# Patient Record
Sex: Female | Born: 1955 | Race: White | Hispanic: No | Marital: Married | Smoking: Never smoker
Health system: Southern US, Community
[De-identification: ages and names within clinical notes are randomized; demographics above are authoritative.]

## PROBLEM LIST (undated history)

## (undated) DIAGNOSIS — M549 Dorsalgia, unspecified: Secondary | ICD-10-CM

## (undated) DIAGNOSIS — Z85528 Personal history of other malignant neoplasm of kidney: Secondary | ICD-10-CM

## (undated) DIAGNOSIS — G4730 Sleep apnea, unspecified: Secondary | ICD-10-CM

## (undated) DIAGNOSIS — E7800 Pure hypercholesterolemia, unspecified: Secondary | ICD-10-CM

## (undated) DIAGNOSIS — M810 Age-related osteoporosis without current pathological fracture: Secondary | ICD-10-CM

## (undated) DIAGNOSIS — C801 Malignant (primary) neoplasm, unspecified: Secondary | ICD-10-CM

## (undated) DIAGNOSIS — R002 Palpitations: Secondary | ICD-10-CM

## (undated) DIAGNOSIS — M2550 Pain in unspecified joint: Secondary | ICD-10-CM

## (undated) DIAGNOSIS — K219 Gastro-esophageal reflux disease without esophagitis: Secondary | ICD-10-CM

## (undated) DIAGNOSIS — D649 Anemia, unspecified: Secondary | ICD-10-CM

## (undated) DIAGNOSIS — I10 Essential (primary) hypertension: Secondary | ICD-10-CM

## (undated) DIAGNOSIS — F419 Anxiety disorder, unspecified: Secondary | ICD-10-CM

## (undated) DIAGNOSIS — F32A Depression, unspecified: Secondary | ICD-10-CM

## (undated) DIAGNOSIS — M0520 Rheumatoid vasculitis with rheumatoid arthritis of unspecified site: Secondary | ICD-10-CM

## (undated) DIAGNOSIS — R0602 Shortness of breath: Secondary | ICD-10-CM

## (undated) HISTORY — DX: Sleep apnea, unspecified: G47.30

## (undated) HISTORY — PX: OTHER SURGICAL HISTORY: SHX169

## (undated) HISTORY — DX: Depression, unspecified: F32.A

## (undated) HISTORY — PX: HERNIA REPAIR: SHX51

## (undated) HISTORY — DX: Palpitations: R00.2

## (undated) HISTORY — DX: Anemia, unspecified: D64.9

## (undated) HISTORY — DX: Rheumatoid vasculitis with rheumatoid arthritis of unspecified site: M05.20

## (undated) HISTORY — PX: REPLACEMENT TOTAL KNEE: SUR1224

## (undated) HISTORY — DX: Pain in unspecified joint: M25.50

## (undated) HISTORY — DX: Age-related osteoporosis without current pathological fracture: M81.0

## (undated) HISTORY — DX: Shortness of breath: R06.02

## (undated) HISTORY — DX: Dorsalgia, unspecified: M54.9

---

## 2013-03-18 DIAGNOSIS — M1710 Unilateral primary osteoarthritis, unspecified knee: Secondary | ICD-10-CM | POA: Insufficient documentation

## 2013-12-07 ENCOUNTER — Emergency Department (HOSPITAL_BASED_OUTPATIENT_CLINIC_OR_DEPARTMENT_OTHER)
Admission: EM | Admit: 2013-12-07 | Discharge: 2013-12-07 | Disposition: A | Discharge status: Home / Self Care | Payer: Worker's Compensation | Attending: Emergency Medicine | Admitting: Emergency Medicine

## 2013-12-07 ENCOUNTER — Emergency Department (HOSPITAL_BASED_OUTPATIENT_CLINIC_OR_DEPARTMENT_OTHER): Payer: Self-pay | Attending: Emergency Medicine

## 2013-12-07 ENCOUNTER — Encounter (HOSPITAL_BASED_OUTPATIENT_CLINIC_OR_DEPARTMENT_OTHER): Payer: Self-pay | Admitting: Emergency Medicine

## 2013-12-07 DIAGNOSIS — R296 Repeated falls: Secondary | ICD-10-CM | POA: Insufficient documentation

## 2013-12-07 DIAGNOSIS — S8000XA Contusion of unspecified knee, initial encounter: Secondary | ICD-10-CM | POA: Diagnosis not present

## 2013-12-07 DIAGNOSIS — Y939 Activity, unspecified: Secondary | ICD-10-CM | POA: Insufficient documentation

## 2013-12-07 DIAGNOSIS — Z85528 Personal history of other malignant neoplasm of kidney: Secondary | ICD-10-CM | POA: Diagnosis not present

## 2013-12-07 DIAGNOSIS — I10 Essential (primary) hypertension: Secondary | ICD-10-CM | POA: Diagnosis not present

## 2013-12-07 DIAGNOSIS — S99919A Unspecified injury of unspecified ankle, initial encounter: Secondary | ICD-10-CM

## 2013-12-07 DIAGNOSIS — S8990XA Unspecified injury of unspecified lower leg, initial encounter: Secondary | ICD-10-CM | POA: Insufficient documentation

## 2013-12-07 DIAGNOSIS — F411 Generalized anxiety disorder: Secondary | ICD-10-CM | POA: Insufficient documentation

## 2013-12-07 DIAGNOSIS — K219 Gastro-esophageal reflux disease without esophagitis: Secondary | ICD-10-CM | POA: Insufficient documentation

## 2013-12-07 DIAGNOSIS — S99929A Unspecified injury of unspecified foot, initial encounter: Secondary | ICD-10-CM

## 2013-12-07 DIAGNOSIS — E7800 Pure hypercholesterolemia, unspecified: Secondary | ICD-10-CM | POA: Diagnosis not present

## 2013-12-07 DIAGNOSIS — S8002XA Contusion of left knee, initial encounter: Secondary | ICD-10-CM

## 2013-12-07 DIAGNOSIS — Y929 Unspecified place or not applicable: Secondary | ICD-10-CM | POA: Insufficient documentation

## 2013-12-07 HISTORY — DX: Personal history of other malignant neoplasm of kidney: Z85.528

## 2013-12-07 HISTORY — DX: Essential (primary) hypertension: I10

## 2013-12-07 HISTORY — DX: Anxiety disorder, unspecified: F41.9

## 2013-12-07 HISTORY — DX: Gastro-esophageal reflux disease without esophagitis: K21.9

## 2013-12-07 HISTORY — DX: Pure hypercholesterolemia, unspecified: E78.00

## 2013-12-07 HISTORY — DX: Malignant (primary) neoplasm, unspecified: C80.1

## 2013-12-07 NOTE — ED Notes (Signed)
Fell last week. Injury to her left knee. Pain, swelling and bruising noted. Hx of knee replacement 2 years ago.

## 2013-12-07 NOTE — ED Provider Notes (Signed)
Medical screening examination/treatment/procedure(s) were performed by non-physician practitioner and as supervising physician I was immediately available for consultation/collaboration.    Dot Lanes, MD 12/07/13 [phone]

## 2013-12-07 NOTE — ED Provider Notes (Signed)
CSN: [id number]     Arrival date & time 12/07/13  1607 History   First MD Initiated Contact with Patient 12/07/13 1741     Chief Complaint  Patient presents with  . Knee Injury     (Consider location/radiation/quality/duration/timing/severity/associated sxs/prior Treatment) Patient is a 58 y.o. female presenting with knee pain. The history is provided by the patient. No language interpreter was used.  Knee Pain Location:  Knee Injury: yes   Knee location:  L knee Dislocation: no   Foreign body present:  No foreign bodies Associated symptoms: no fever   Associated symptoms comment:  She presents with complaint of left knee pain and swelling since mechanical fall 5 days ago. She has been ambulating since the fall but has continued pain and swelling of the knee. She has a history of arthroplasty and was concerned about bony injury.   Past Medical History  Diagnosis Date  . GERD (gastroesophageal reflux disease)   . Hypertension   . Anxiety   . High cholesterol   . Cancer   . History of kidney cancer    Past Surgical History  Procedure Laterality Date  . Replacement total knee    . Hernia repair    . Kidney cancer surgery     No family history on file. History  Substance Use Topics  . Smoking status: Never Smoker   . Smokeless tobacco: Not on file  . Alcohol Use: No   OB History   Grav Para Term Preterm Abortions TAB SAB Ect Mult Living                 Review of Systems  Constitutional: Negative for fever and chills.  Musculoskeletal:       See HPI.  Skin: Positive for color change.  Neurological: Negative.       Allergies  Simvastatin  Home Medications   Prior to Admission medications   Medication Sig Start Date End Date Taking? Authorizing Provider  Escitalopram Oxalate (LEXAPRO PO) Take by mouth.   Yes Historical Provider, MD  OMEPRAZOLE PO Take by mouth.   Yes Historical Provider, MD  Pravastatin Sodium (PRAVACHOL PO) Take by mouth.   Yes Historical  Provider, MD  VALSARTAN PO Take by mouth.   Yes Historical Provider, MD   BP 129/75  Pulse 72  Temp(Src) 97.8 F (36.6 C) (Oral)  Resp 18  SpO2 100% Physical Exam  Constitutional: She is oriented to person, place, and time. She appears well-developed and well-nourished.  Neck: Normal range of motion.  Pulmonary/Chest: Effort normal.  Musculoskeletal:  Left knee moderately swollen anteriorly with bruising. Well healed surgical incision. Joint stable. No bony deformity.  Neurological: She is alert and oriented to person, place, and time.  Skin: Skin is warm and dry.  Psychiatric: She has a normal mood and affect.    ED Course  Procedures (including critical care time) Labs Review Labs Reviewed - No data to display  Imaging Review Dg Knee Complete 4 Views Left  12/07/2013   CLINICAL DATA:  Fall 5 days ago on the left knee.  EXAM: LEFT KNEE - COMPLETE 4+ VIEW  COMPARISON:  None.  FINDINGS: Previous left knee arthroplasty. No evidence for periprosthetic fracture or dislocation. No joint effusion identified.  IMPRESSION: 1. No acute findings. 2. Previous left knee arthroplasty.   Electronically Signed   By: Kerby Moors M.D.   On: 12/07/2013 16:47     EKG Interpretation None      MDM   Final  diagnoses:  Contusion, knee, left, initial encounter    Contusion injury 58 days old without acute fracture. Discussed supportive care. Continue ibuprofen, ice elevation.     Dewaine Oats, PA-C 12/07/13 1820

## 2013-12-07 NOTE — Discharge Instructions (Signed)
Cryotherapy °Cryotherapy means treatment with cold. Ice or gel packs can be used to reduce both pain and swelling. Ice is the most helpful within the first 24 to 48 hours after an injury or flare-up from overusing a muscle or joint. Sprains, strains, spasms, burning pain, shooting pain, and aches can all be eased with ice. Ice can also be used when recovering from surgery. Ice is effective, has very few side effects, and is safe for most people to use. °PRECAUTIONS  °Ice is not a safe treatment option for people with: °· Raynaud phenomenon. This is a condition affecting small blood vessels in the extremities. Exposure to cold may cause your problems to return. °· Cold hypersensitivity. There are many forms of cold hypersensitivity, including: °¨ Cold urticaria. Red, itchy hives appear on the skin when the tissues begin to warm after being iced. °¨ Cold erythema. This is a red, itchy rash caused by exposure to cold. °¨ Cold hemoglobinuria. Red blood cells break down when the tissues begin to warm after being iced. The hemoglobin that carry oxygen are passed into the urine because they cannot combine with blood proteins fast enough. °· Numbness or altered sensitivity in the area being iced. °If you have any of the following conditions, do not use ice until you have discussed cryotherapy with your caregiver: °· Heart conditions, such as arrhythmia, angina, or chronic heart disease. °· High blood pressure. °· Healing wounds or open skin in the area being iced. °· Current infections. °· Rheumatoid arthritis. °· Poor circulation. °· Diabetes. °Ice slows the blood flow in the region it is applied. This is beneficial when trying to stop inflamed tissues from spreading irritating chemicals to surrounding tissues. However, if you expose your skin to cold temperatures for too long or without the proper protection, you can damage your skin or nerves. Watch for signs of skin damage due to cold. °HOME CARE INSTRUCTIONS °Follow  these tips to use ice and cold packs safely. °· Place a dry or damp towel between the ice and skin. A damp towel will cool the skin more quickly, so you may need to shorten the time that the ice is used. °· For a more rapid response, add gentle compression to the ice. °· Ice for no more than 10 to 20 minutes at a time. The bonier the area you are icing, the less time it will take to get the benefits of ice. °· Check your skin after 5 minutes to make sure there are no signs of a poor response to cold or skin damage. °· Rest 20 minutes or more between uses. °· Once your skin is numb, you can end your treatment. You can test numbness by very lightly touching your skin. The touch should be so light that you do not see the skin dimple from the pressure of your fingertip. When using ice, most people will feel these normal sensations in this order: cold, burning, aching, and numbness. °· Do not use ice on someone who cannot communicate their responses to pain, such as small children or people with dementia. °HOW TO MAKE AN ICE PACK °Ice packs are the most common way to use ice therapy. Other methods include ice massage, ice baths, and cryosprays. Muscle creams that cause a cold, tingly feeling do not offer the same benefits that ice offers and should not be used as a substitute unless recommended by your caregiver. °To make an ice pack, do one of the following: °· Place crushed ice or a   bag of frozen vegetables in a sealable plastic bag. Squeeze out the excess air. Place this bag inside another plastic bag. Slide the bag into a pillowcase or place a damp towel between your skin and the bag. °· Mix 3 parts water with 1 part rubbing alcohol. Freeze the mixture in a sealable plastic bag. When you remove the mixture from the freezer, it will be slushy. Squeeze out the excess air. Place this bag inside another plastic bag. Slide the bag into a pillowcase or place a damp towel between your skin and the bag. °SEEK MEDICAL CARE  IF: °· You develop white spots on your skin. This may give the skin a blotchy (mottled) appearance. °· Your skin turns blue or pale. °· Your skin becomes waxy or hard. °· Your swelling gets worse. °MAKE SURE YOU:  °· Understand these instructions. °· Will watch your condition. °· Will get help right away if you are not doing well or get worse. °Document Released: 11/20/2010 Document Revised: 08/10/2013 Document Reviewed: 11/20/2010 °ExitCare® Patient Information ©2015 [employer]. This information is not intended to replace advice given to you by your health care provider. Make sure you discuss any questions you have with your health care provider. ° °Contusion °A contusion is a deep bruise. Contusions are the result of an injury that caused bleeding under the skin. The contusion may turn blue, purple, or yellow. Minor injuries will give you a painless contusion, but more severe contusions may stay painful and swollen for a few weeks.  °CAUSES  °A contusion is usually caused by a blow, trauma, or direct force to an area of the body. °SYMPTOMS  °· Swelling and redness of the injured area. °· Bruising of the injured area. °· Tenderness and soreness of the injured area. °· Pain. °DIAGNOSIS  °The diagnosis can be made by taking a history and physical exam. An X-ray, CT scan, or MRI may be needed to determine if there were any associated injuries, such as fractures. °TREATMENT  °Specific treatment will depend on what area of the body was injured. In general, the best treatment for a contusion is resting, icing, elevating, and applying cold compresses to the injured area. Over-the-counter medicines may also be recommended for pain control. Ask your caregiver what the best treatment is for your contusion. °HOME CARE INSTRUCTIONS  °· Put ice on the injured area. °¨ Put ice in a plastic bag. °¨ Place a towel between your skin and the bag. °¨ Leave the ice on for 15-20 minutes, 3-4 times a day, or as directed by your health  care provider. °· Only take over-the-counter or prescription medicines for pain, discomfort, or fever as directed by your caregiver. Your caregiver may recommend avoiding anti-inflammatory medicines (aspirin, ibuprofen, and naproxen) for 48 hours because these medicines may increase bruising. °· Rest the injured area. °· If possible, elevate the injured area to reduce swelling. °SEEK IMMEDIATE MEDICAL CARE IF:  °· You have increased bruising or swelling. °· You have pain that is getting worse. °· Your swelling or pain is not relieved with medicines. °MAKE SURE YOU:  °· Understand these instructions. °· Will watch your condition. °· Will get help right away if you are not doing well or get worse. °Document Released: 01/03/2005 Document Revised: 03/31/2013 Document Reviewed: 01/29/2011 °ExitCare® Patient Information ©2015 [employer]. This information is not intended to replace advice given to you by your health care provider. Make sure you discuss any questions you have with your health care provider. ° °

## 2013-12-08 DIAGNOSIS — M546 Pain in thoracic spine: Secondary | ICD-10-CM | POA: Insufficient documentation

## 2013-12-08 DIAGNOSIS — Z8719 Personal history of other diseases of the digestive system: Secondary | ICD-10-CM | POA: Insufficient documentation

## 2013-12-08 DIAGNOSIS — H6120 Impacted cerumen, unspecified ear: Secondary | ICD-10-CM | POA: Insufficient documentation

## 2013-12-08 DIAGNOSIS — M461 Sacroiliitis, not elsewhere classified: Secondary | ICD-10-CM | POA: Insufficient documentation

## 2013-12-08 DIAGNOSIS — R51 Headache: Secondary | ICD-10-CM

## 2013-12-08 DIAGNOSIS — I10 Essential (primary) hypertension: Secondary | ICD-10-CM | POA: Insufficient documentation

## 2013-12-08 DIAGNOSIS — M159 Polyosteoarthritis, unspecified: Secondary | ICD-10-CM | POA: Insufficient documentation

## 2013-12-08 DIAGNOSIS — M47816 Spondylosis without myelopathy or radiculopathy, lumbar region: Secondary | ICD-10-CM | POA: Insufficient documentation

## 2013-12-08 DIAGNOSIS — M5417 Radiculopathy, lumbosacral region: Secondary | ICD-10-CM

## 2013-12-08 DIAGNOSIS — M8580 Other specified disorders of bone density and structure, unspecified site: Secondary | ICD-10-CM | POA: Insufficient documentation

## 2013-12-08 DIAGNOSIS — G4731 Primary central sleep apnea: Secondary | ICD-10-CM | POA: Insufficient documentation

## 2013-12-08 DIAGNOSIS — G4733 Obstructive sleep apnea (adult) (pediatric): Secondary | ICD-10-CM | POA: Insufficient documentation

## 2013-12-08 DIAGNOSIS — K439 Ventral hernia without obstruction or gangrene: Secondary | ICD-10-CM | POA: Insufficient documentation

## 2013-12-08 DIAGNOSIS — M79673 Pain in unspecified foot: Secondary | ICD-10-CM | POA: Insufficient documentation

## 2013-12-08 DIAGNOSIS — M5136 Other intervertebral disc degeneration, lumbar region: Secondary | ICD-10-CM | POA: Insufficient documentation

## 2013-12-08 DIAGNOSIS — E559 Vitamin D deficiency, unspecified: Secondary | ICD-10-CM | POA: Insufficient documentation

## 2013-12-08 DIAGNOSIS — R5383 Other fatigue: Secondary | ICD-10-CM | POA: Insufficient documentation

## 2013-12-08 DIAGNOSIS — K219 Gastro-esophageal reflux disease without esophagitis: Secondary | ICD-10-CM | POA: Insufficient documentation

## 2013-12-08 DIAGNOSIS — N950 Postmenopausal bleeding: Secondary | ICD-10-CM | POA: Insufficient documentation

## 2013-12-08 DIAGNOSIS — R519 Headache, unspecified: Secondary | ICD-10-CM | POA: Insufficient documentation

## 2013-12-08 DIAGNOSIS — D509 Iron deficiency anemia, unspecified: Secondary | ICD-10-CM | POA: Insufficient documentation

## 2013-12-08 DIAGNOSIS — R3916 Straining to void: Secondary | ICD-10-CM | POA: Insufficient documentation

## 2013-12-08 DIAGNOSIS — M25559 Pain in unspecified hip: Secondary | ICD-10-CM | POA: Insufficient documentation

## 2013-12-08 DIAGNOSIS — G4485 Primary stabbing headache: Secondary | ICD-10-CM | POA: Insufficient documentation

## 2013-12-08 DIAGNOSIS — M545 Low back pain, unspecified: Secondary | ICD-10-CM | POA: Insufficient documentation

## 2013-12-08 DIAGNOSIS — L209 Atopic dermatitis, unspecified: Secondary | ICD-10-CM | POA: Insufficient documentation

## 2013-12-08 DIAGNOSIS — G4700 Insomnia, unspecified: Secondary | ICD-10-CM | POA: Insufficient documentation

## 2013-12-08 DIAGNOSIS — R42 Dizziness and giddiness: Secondary | ICD-10-CM | POA: Insufficient documentation

## 2013-12-08 DIAGNOSIS — M2240 Chondromalacia patellae, unspecified knee: Secondary | ICD-10-CM | POA: Insufficient documentation

## 2013-12-08 DIAGNOSIS — F419 Anxiety disorder, unspecified: Secondary | ICD-10-CM | POA: Insufficient documentation

## 2013-12-08 DIAGNOSIS — F329 Major depressive disorder, single episode, unspecified: Secondary | ICD-10-CM | POA: Insufficient documentation

## 2013-12-08 DIAGNOSIS — R350 Frequency of micturition: Secondary | ICD-10-CM | POA: Insufficient documentation

## 2013-12-08 DIAGNOSIS — F32A Depression, unspecified: Secondary | ICD-10-CM | POA: Insufficient documentation

## 2013-12-08 DIAGNOSIS — G253 Myoclonus: Secondary | ICD-10-CM | POA: Insufficient documentation

## 2013-12-08 DIAGNOSIS — M5414 Radiculopathy, thoracic region: Secondary | ICD-10-CM | POA: Insufficient documentation

## 2014-04-14 ENCOUNTER — Encounter: Payer: Self-pay | Admitting: Neurology

## 2014-04-14 ENCOUNTER — Ambulatory Visit (INDEPENDENT_AMBULATORY_CARE_PROVIDER_SITE_OTHER): Payer: BLUE CROSS/BLUE SHIELD | Admitting: Neurology

## 2014-04-14 VITALS — BP 130/78 | HR 64 | Resp 16 | Ht 63.75 in | Wt 194.2 lb

## 2014-04-14 DIAGNOSIS — G4730 Sleep apnea, unspecified: Secondary | ICD-10-CM

## 2014-04-14 MED ORDER — ZOLPIDEM TARTRATE 5 MG PO TABS: 5.0000 mg | ORAL_TABLET | Freq: Every day | ORAL | Status: DC

## 2014-04-14 NOTE — Progress Notes (Signed)
[facility]  PATIENT: Karen Cannon DOB: 05-09-1955  REFERRING CLINICIAN: Dr. Jefm Petty HISTORY FROM: Patient REASON FOR VISIT: Obstructive sleep apnea   HISTORICAL  CHIEF COMPLAINT:  Obstructive sleep apnea  HISTORY OF PRESENT ILLNESS:  She is a 59 year old woman who was diagnosed with severe obstructive sleep apnea with some central sleep apneas. She has been on BiPAP therapy and tolerates it well. I interrogated her BiPAP machine. It shows that she has excellent compliance with 30/30 days of use for more than 4 hours. Her average night was about 9 hours. The AHI was 8.1. She had severe OSA before being placed on BiPAP. She denies any excessive daytime sleepiness. She never really had an issue with this prior to BiPAP therapy. However, she does feel better when she uses it for the entire night. She occasionally wakes up with some leakage or some difficulty breathing out. However she feels that she tolerates it well. She takes the BiPAP with her wherever she travels.  She also reports insomnia. This has been greatly helped with zolpidem. She takes 5 mg 5 nights a week (not taking a weekends). She tolerates it well. She has not had any unusual reaction to the zolpidem.  REVIEW OF SYSTEMS: Full 14 system review of systems performed and notable only for insomnia. She denied headaches or respiratory difficulties.  ALLERGIES: Allergies  Allergen Reactions  . Simvastatin     cramps    HOME MEDICATIONS: Outpatient Prescriptions Prior to Visit  Medication Sig Dispense Refill  . Escitalopram Oxalate (LEXAPRO PO) Take by mouth.    . OMEPRAZOLE PO Take by mouth.    . Pravastatin Sodium (PRAVACHOL PO) Take by mouth.    . VALSARTAN PO Take by mouth.     No facility-administered medications prior to visit.    PAST MEDICAL HISTORY: Past Medical History  Diagnosis Date  . GERD (gastroesophageal reflux disease)   . Hypertension   . Anxiety   . High  cholesterol   . Cancer   . History of kidney cancer   . Sleep apnea     PAST SURGICAL HISTORY: Past Surgical History  Procedure Laterality Date  . Replacement total knee    . Hernia repair    . Kidney cancer surgery      FAMILY HISTORY: She denies a history of OSA in family members.  SOCIAL HISTORY:  History   Social History  . Marital Status: Married    Spouse Name: N/A    Number of Children: 3  . Years of Education: Masters   Occupational History  . Not on file.   Social History Main Topics  . Smoking status: Never Smoker   . Smokeless tobacco: Never Used  . Alcohol Use: No  . Drug Use: No  . Sexual Activity: Not on file   Other Topics Concern  . Not on file   Social History Narrative   Patient is married with one child.   Patient is left handed.   Patient has her Master's degree.   Patient drinks 2 cups daily.        PHYSICAL EXAM  Filed Vitals:   04/14/14 1537  BP: 130/78  Pulse: 64  Resp: 16  Height: 5' 3.75" (1.619 m)  Weight: 194 lb 3.2 oz (88.089 kg)    Body mass index is 33.61 kg/(m^2).  No exam data present  No flowsheet data found.  GENERAL EXAM: Patient is in no distress; well developed, nourished and groomed; neck is supple.  Pharynx is non-erythematous.  NEUROLOGIC: MENTAL STATUS: awake, alert, oriented to person, place and time, recent and remote memory intact, normal attention and concentration, language fluent, comprehension intact, naming intact, fund of knowledge appropriate CRANIAL NERVE: extraocular muscles intact, no nystagmus, facial sensation and strength symmetric, hearing intact, palate elevates symmetrically, uvula midline, shoulder shrug symmetric, tongue midline. MOTOR: normal bulk and tone, full strength in the BUE, BLE SENSORY: normal and symmetric to light touch COORDINATION: finger-nose-finger, fine finger movements, heel-shin normal REFLEXES: deep tendon reflexes present and symmetric GAIT/STATION: narrow  based gait; able to walk on toes, heels and tandem    DIAGNOSTIC DATA (LABS, IMAGING, TESTING) - I reviewed patient records, labs, notes, testing and imaging myself where available. Specifically, her BiPAP machine was interrogated and she was found to have excellent compliance at 100% and an AHI equals 8.1.   ASSESSMENT AND PLAN  59 y.o. year old female here with obstructive sleep apnea that is reasonably well controlled on oral BiPAP with a pressure range of 5-20. Compliance is excellent at 100%. Her insomnia is well controlled with zolpidem. She will continue on BiPAP current settings. She will return in one year or call sooner if any problems.   Meds ordered this encounter  Medications  . iron polysaccharides (NIFEREX) 150 MG capsule    Sig: Take by mouth.  . valsartan-hydrochlorothiazide (DIOVAN-HCT) 160-12.5 MG per tablet    Sig:   . zolpidem (AMBIEN) 5 MG tablet    Sig: 5 mg.  . escitalopram (LEXAPRO) 20 MG tablet    Sig: Take 20 mg by mouth.  Marland Kitchen omeprazole (PRILOSEC) 20 MG capsule    Sig: Take 20 mg by mouth.  . DISCONTD: zolpidem (AMBIEN) 5 MG tablet    Sig: Take 5 mg by mouth.  . B Complex Vitamins (VITAMIN-B COMPLEX) TABS    Sig: Take as directed.  Marland Kitchen RIBOFLAVIN PO    Sig: Take as directed.  . zolpidem (AMBIEN) 5 MG tablet    Sig: Take 1 tablet (5 mg total) by mouth at bedtime.    Dispense:  30 tablet    Refill:  5    Follow up in 12 months.   Call sooner if problems.    Britt Bottom M.D. PhD 0/0/3704, 8:88 PM Certified in Neurology, Sleep Medicine , Neurophysiology and Neuroimaging  [facility] [address] [place], [place] [address] [phone]

## 2014-08-26 ENCOUNTER — Telehealth: Payer: Self-pay | Admitting: Neurology

## 2014-08-26 NOTE — Telephone Encounter (Signed)
Ins has been contacted and provided with clinical info.  Request is currently under review Ref Key: [id number].  I called the patient back to advise, got no answer.  Left message.

## 2014-08-26 NOTE — Telephone Encounter (Signed)
Patient called stating that she needs a prior authorization for zolpidem (AMBIEN) 5 MG tablet. Patient advises that her [place] needs to be contacted in order for her to receive the 30 days supply. Patient can be reached @ [phone]

## 2014-08-30 ENCOUNTER — Telehealth: Payer: Self-pay

## 2014-08-30 NOTE — Telephone Encounter (Signed)
[place] has approved the request for coverage on Zolpidem effective until 08/26/2015 Ref # [id number].  They have notified patient to advise of this decision.

## 2014-11-16 ENCOUNTER — Telehealth: Payer: Self-pay | Admitting: Neurology

## 2014-11-16 MED ORDER — ZOLPIDEM TARTRATE 5 MG PO TABS: 5.0000 mg | ORAL_TABLET | Freq: Every day | ORAL | Status: DC

## 2014-11-16 NOTE — Telephone Encounter (Signed)
Patient is calling to renew Rx zolpidem 5 mg. Thanks!

## 2014-11-16 NOTE — Telephone Encounter (Signed)
Request entered, forwarded to provider for approval.  Patient was last seen in Jan, OV notes say follow up in 12 months.

## 2014-11-17 ENCOUNTER — Encounter: Payer: Self-pay | Admitting: *Deleted

## 2014-11-17 NOTE — Progress Notes (Signed)
Ambien rx. up front [facility]

## 2014-11-22 NOTE — Telephone Encounter (Signed)
Per note in chart, this Rx was placed at the front desk for pick up on 08/10.  I called the patient back.  Got no answer.  Left message.

## 2014-11-22 NOTE — Telephone Encounter (Signed)
Patient called to inquire about refill. She needs it before school starts.

## 2015-01-19 ENCOUNTER — Emergency Department (HOSPITAL_BASED_OUTPATIENT_CLINIC_OR_DEPARTMENT_OTHER): Payer: Worker's Compensation

## 2015-01-19 ENCOUNTER — Emergency Department (HOSPITAL_BASED_OUTPATIENT_CLINIC_OR_DEPARTMENT_OTHER)
Admission: EM | Admit: 2015-01-19 | Discharge: 2015-01-19 | Disposition: A | Discharge status: Home / Self Care | Payer: Worker's Compensation | Attending: Emergency Medicine | Admitting: Emergency Medicine

## 2015-01-19 ENCOUNTER — Encounter (HOSPITAL_BASED_OUTPATIENT_CLINIC_OR_DEPARTMENT_OTHER): Payer: Self-pay | Admitting: *Deleted

## 2015-01-19 DIAGNOSIS — Y9389 Activity, other specified: Secondary | ICD-10-CM | POA: Insufficient documentation

## 2015-01-19 DIAGNOSIS — E7800 Pure hypercholesterolemia, unspecified: Secondary | ICD-10-CM | POA: Insufficient documentation

## 2015-01-19 DIAGNOSIS — W19XXXA Unspecified fall, initial encounter: Secondary | ICD-10-CM

## 2015-01-19 DIAGNOSIS — S8000XA Contusion of unspecified knee, initial encounter: Secondary | ICD-10-CM

## 2015-01-19 DIAGNOSIS — Y9289 Other specified places as the place of occurrence of the external cause: Secondary | ICD-10-CM | POA: Insufficient documentation

## 2015-01-19 DIAGNOSIS — Z96651 Presence of right artificial knee joint: Secondary | ICD-10-CM | POA: Diagnosis not present

## 2015-01-19 DIAGNOSIS — W010XXA Fall on same level from slipping, tripping and stumbling without subsequent striking against object, initial encounter: Secondary | ICD-10-CM | POA: Diagnosis not present

## 2015-01-19 DIAGNOSIS — Z8669 Personal history of other diseases of the nervous system and sense organs: Secondary | ICD-10-CM | POA: Insufficient documentation

## 2015-01-19 DIAGNOSIS — Z85528 Personal history of other malignant neoplasm of kidney: Secondary | ICD-10-CM | POA: Diagnosis not present

## 2015-01-19 DIAGNOSIS — Y990 Civilian activity done for income or pay: Secondary | ICD-10-CM | POA: Diagnosis not present

## 2015-01-19 DIAGNOSIS — Z859 Personal history of malignant neoplasm, unspecified: Secondary | ICD-10-CM | POA: Insufficient documentation

## 2015-01-19 DIAGNOSIS — F419 Anxiety disorder, unspecified: Secondary | ICD-10-CM | POA: Insufficient documentation

## 2015-01-19 DIAGNOSIS — Z96652 Presence of left artificial knee joint: Secondary | ICD-10-CM | POA: Diagnosis not present

## 2015-01-19 DIAGNOSIS — K219 Gastro-esophageal reflux disease without esophagitis: Secondary | ICD-10-CM | POA: Insufficient documentation

## 2015-01-19 DIAGNOSIS — I10 Essential (primary) hypertension: Secondary | ICD-10-CM | POA: Insufficient documentation

## 2015-01-19 DIAGNOSIS — S8991XA Unspecified injury of right lower leg, initial encounter: Secondary | ICD-10-CM | POA: Diagnosis present

## 2015-01-19 NOTE — Discharge Instructions (Signed)
You may take ibuprofen as prescribed over-the-counter and continue using ice for pain relief. Follow-up with your primary care provider in the next 3-4 days. Return to the emergency department if symptoms worsen.

## 2015-01-19 NOTE — ED Notes (Signed)
Patient transported to X-ray and returned at this time 

## 2015-01-19 NOTE — ED Notes (Signed)
Pt amb to triage with quick steady gait in nad. Pt reports trip and fall this am over a rocking chair at work. Pt reports bilateral knee pain and left hip pain, fell to her knees and has had bilateral knee replacements in the past. Denies head injury or any other c/o.

## 2015-01-19 NOTE — ED Provider Notes (Signed)
CSN: [id number]     Arrival date & time 01/19/15  1056 History   First MD Initiated Contact with Patient 01/19/15 1158     Chief Complaint  Patient presents with  . Knee Pain     (Consider location/radiation/quality/duration/timing/severity/associated sxs/prior Treatment) HPI Comments: Patient is a 59 year old female who presents to ED with complaint of bilateral knee pain. Patient reports she tripped over a rocking chair at work this morning causing her to fall on her knees. Denies LOC or head injury. Endorses pain to bilateral knees with mild swelling and left hip pain. Denies numbness, tingling, weakness, headache, lightheadedness, dizziness. Patient states she had right knee replacement approximately 5 years ago and left knee replacement approximately 3 years ago. Denies being on any blood thinners. She reports using ice after the fall and endorses mild relief of pain.   Past Medical History  Diagnosis Date  . GERD (gastroesophageal reflux disease)   . Hypertension   . Anxiety   . High cholesterol   . Cancer ([place])   . History of kidney cancer   . Sleep apnea    Past Surgical History  Procedure Laterality Date  . Replacement total knee    . Hernia repair    . Kidney cancer surgery     History reviewed. No pertinent family history. Social History  Substance Use Topics  . Smoking status: Never Smoker   . Smokeless tobacco: Never Used  . Alcohol Use: No   OB History    No data available     Review of Systems  Constitutional: Negative for fever and chills.  Musculoskeletal: Positive for joint swelling and arthralgias. Negative for back pain, neck pain and neck stiffness.  Skin: Negative for wound.  Neurological: Negative for dizziness, weakness, numbness and headaches.      Allergies  Simvastatin  Home Medications   Prior to Admission medications   Medication Sig Start Date End Date Taking? Authorizing Provider  B Complex Vitamins (VITAMIN-B COMPLEX) TABS Take  as directed.    Historical Provider, MD  escitalopram (LEXAPRO) 20 MG tablet Take 20 mg by mouth.    Historical Provider, MD  Escitalopram Oxalate (LEXAPRO PO) Take by mouth.    Historical Provider, MD  iron polysaccharides (NIFEREX) 150 MG capsule Take by mouth.    Historical Provider, MD  omeprazole (PRILOSEC) 20 MG capsule Take 20 mg by mouth.    Historical Provider, MD  OMEPRAZOLE PO Take by mouth.    Historical Provider, MD  Pravastatin Sodium (PRAVACHOL PO) Take by mouth.    Historical Provider, MD  RIBOFLAVIN PO Take as directed.    Historical Provider, MD  VALSARTAN PO Take by mouth.    Historical Provider, MD  valsartan-hydrochlorothiazide (DIOVAN-HCT) 160-12.5 MG per tablet  02/26/13   Historical Provider, MD  zolpidem (AMBIEN) 5 MG tablet Take 1 tablet (5 mg total) by mouth at bedtime. 11/16/14   Britt Bottom, MD   There were no vitals taken for this visit. Physical Exam  Constitutional: She is oriented to person, place, and time. She appears well-developed and well-nourished. No distress.  HENT:  Head: Normocephalic and atraumatic.  Eyes: Conjunctivae and EOM are normal. Right eye exhibits no discharge. Left eye exhibits no discharge. No scleral icterus.  Neck: Normal range of motion. Neck supple.  Cardiovascular: Normal rate.   Pulmonary/Chest: Effort normal.  Musculoskeletal: Normal range of motion. She exhibits edema and tenderness.       Left hip: Normal. She exhibits normal range of motion,  normal strength, no tenderness, no bony tenderness, no swelling, no crepitus, no deformity and no laceration.       Right knee: She exhibits swelling. She exhibits normal range of motion, no effusion, no ecchymosis, no deformity, no laceration, no erythema, normal alignment, no LCL laxity, no bony tenderness and no MCL laxity. No tenderness found.       Left knee: She exhibits swelling. She exhibits normal range of motion, no effusion, no ecchymosis, no deformity, no laceration, no  erythema, normal alignment, no LCL laxity, normal patellar mobility, no bony tenderness and no MCL laxity. No tenderness found.  Mild swelling and TTP noted to anterior bilateral knees. Full range of motion of bilateral knees. Patient able to stand and ambulate and room without assistance. Sensation intact. 2+ distal pulses. 5/ 5 strength.  Neurological: She is alert and oriented to person, place, and time. She has normal strength and normal reflexes. No sensory deficit. Gait normal.  Skin: Skin is warm and dry.  Nursing note and vitals reviewed.   ED Course  Procedures (including critical care time) Labs Review Labs Reviewed - No data to display  Imaging Review No results found. I have personally reviewed and evaluated these images and lab results as part of my medical decision-making.    MDM   Final diagnoses:  Fall, initial encounter  Knee contusion, unspecified laterality, initial encounter    Patient presents with bilateral knee pain status post fall that occurred due to tripping. Patient denies LOC or head injury. Patient is on any blood thinners. History of bilateral knee replacements. Exam revealed mild swelling and TTP to bilateral anterior knees. Full range of motion in bilateral knees, neurovascularly intact. Patient able to stand and ambulate and room. Right and left knee x-rays negative. I suspect pain is likely due to knee contusion associated with fall. Plan to discharge patient home, advised patient to continue using ibuprofen and ice for pain relief. Advised patient to follow up with her primary care provider this week.  Evaluation does not show pathology requring ongoing emergent intervention or admission. Pt is hemodynamically stable and mentating appropriately. Discussed findings/results and plan with patient/guardian, who agrees with plan. All questions answered. Return precautions discussed and outpatient follow up given.      Chesley Noon [place],  [place] 01/19/15 1357  Fredia Sorrow, MD 01/19/15 1547

## 2015-01-19 NOTE — ED Notes (Signed)
PA at bedside.

## 2015-05-16 ENCOUNTER — Telehealth: Payer: Self-pay | Admitting: Neurology

## 2015-05-16 NOTE — Telephone Encounter (Signed)
Patient is calling and has set an 1 year f/up appt for sleep apnea. She is asking what she should bring with her, should she have the disc in the machine down-loaded before she comes?  Thanks!

## 2015-05-16 NOTE — Telephone Encounter (Signed)
I have spoken with Karen Cannon this afternoon--she has new sx. of daytime fatigue, sts. similar to when she was dx. with osa.  I have advised she bring her machine and chip with  her/fim

## 2015-05-18 DIAGNOSIS — E785 Hyperlipidemia, unspecified: Secondary | ICD-10-CM | POA: Insufficient documentation

## 2015-05-18 DIAGNOSIS — IMO0002 Reserved for concepts with insufficient information to code with codable children: Secondary | ICD-10-CM | POA: Insufficient documentation

## 2015-05-18 DIAGNOSIS — E611 Iron deficiency: Secondary | ICD-10-CM | POA: Insufficient documentation

## 2015-05-18 DIAGNOSIS — I10 Essential (primary) hypertension: Secondary | ICD-10-CM | POA: Insufficient documentation

## 2015-05-18 DIAGNOSIS — M810 Age-related osteoporosis without current pathological fracture: Secondary | ICD-10-CM | POA: Insufficient documentation

## 2015-05-26 DIAGNOSIS — R351 Nocturia: Secondary | ICD-10-CM | POA: Insufficient documentation

## 2015-05-26 DIAGNOSIS — Z85528 Personal history of other malignant neoplasm of kidney: Secondary | ICD-10-CM | POA: Insufficient documentation

## 2015-06-01 ENCOUNTER — Ambulatory Visit (INDEPENDENT_AMBULATORY_CARE_PROVIDER_SITE_OTHER): Payer: BC Managed Care – PPO | Admitting: Neurology

## 2015-06-01 ENCOUNTER — Encounter: Payer: Self-pay | Admitting: Neurology

## 2015-06-01 VITALS — BP 126/88 | HR 64 | Resp 16 | Ht 63.75 in | Wt 194.4 lb

## 2015-06-01 DIAGNOSIS — G4733 Obstructive sleep apnea (adult) (pediatric): Secondary | ICD-10-CM

## 2015-06-01 DIAGNOSIS — G4452 New daily persistent headache (NDPH): Secondary | ICD-10-CM

## 2015-06-01 DIAGNOSIS — G4700 Insomnia, unspecified: Secondary | ICD-10-CM

## 2015-06-01 DIAGNOSIS — M542 Cervicalgia: Secondary | ICD-10-CM | POA: Diagnosis not present

## 2015-06-01 DIAGNOSIS — R5383 Other fatigue: Secondary | ICD-10-CM

## 2015-06-01 MED ORDER — ZOLPIDEM TARTRATE 5 MG PO TABS: 7.5000 mg | ORAL_TABLET | Freq: Every day | ORAL | Status: DC

## 2015-06-01 NOTE — Progress Notes (Signed)
[facility]  PATIENT: Karen Cannon DOB: 04/25/1955  REFERRING CLINICIAN: Dr. Jefm Petty HISTORY FROM: Patient REASON FOR VISIT: Obstructive sleep apnea   Chief Complaint    Sleep Apnea; Insomnia      HISTORICAL  CHIEF COMPLAINT:  Obstructive sleep apnea  HISTORY OF PRESENT ILLNESS:  She is a 60 year old woman with severe obstructive sleep apnea with some central sleep apneas. She is on BiPAP.   She has had more insomnia and also notes being more unrefreshed when she wakes up   She is on BiPAP therapy and tolerates it well. Download shows that she has excellent compliance with 30/30 days of use for more than 4 hours. Her average night was > 9 hours. The AHI was 9.8 (was 8.1 at last visit).   She had severe OSA before being placed on BiPAP. She denies any excessive daytime sleepiness. She never really had an issue with this prior to BiPAP therapy.  However she feels that she tolerates it well. She takes the BiPAP with her wherever she travels.   Over the past few months, she notes more problems with feeling less refreshed when she wakes up.  She also has insomnia, with more difficulty falling asleep than staying asleep. Zolpidem helps but sometimes she needs to take 1.5 pills.    She tolerates it well. She has not had any unusual reaction to the zolpidem.  Headaches are occurring much more frequently, now every day.   She has some neck discomfort.      REVIEW OF SYSTEMS: Full 14 system review of systems performed and notable only for insomnia. She reports headaches  ALLERGIES: Allergies  Allergen Reactions  . Simvastatin     cramps    HOME MEDICATIONS: Outpatient Prescriptions Prior to Visit  Medication Sig Dispense Refill  . escitalopram (LEXAPRO) 20 MG tablet Take 20 mg by mouth.    . Escitalopram Oxalate (LEXAPRO PO) Take by mouth.    . iron polysaccharides (NIFEREX) 150 MG capsule Take by mouth.    Marland Kitchen omeprazole (PRILOSEC) 20 MG capsule Take  20 mg by mouth.    . OMEPRAZOLE PO Take by mouth.    . Pravastatin Sodium (PRAVACHOL PO) Take by mouth.    . VALSARTAN PO Take by mouth.    . valsartan-hydrochlorothiazide (DIOVAN-HCT) 160-12.5 MG per tablet     . zolpidem (AMBIEN) 5 MG tablet Take 1 tablet (5 mg total) by mouth at bedtime. 30 tablet 5  . B Complex Vitamins (VITAMIN-B COMPLEX) TABS Reported on 06/01/2015    . RIBOFLAVIN PO Reported on 06/01/2015     No facility-administered medications prior to visit.    PAST MEDICAL HISTORY: Past Medical History  Diagnosis Date  . GERD (gastroesophageal reflux disease)   . Hypertension   . Anxiety   . High cholesterol   . Cancer ([place])   . History of kidney cancer   . Sleep apnea     PAST SURGICAL HISTORY: Past Surgical History  Procedure Laterality Date  . Replacement total knee    . Hernia repair    . Kidney cancer surgery      FAMILY HISTORY: She denies a history of OSA in family members.  SOCIAL HISTORY:  Social History   Social History  . Marital Status: Married    Spouse Name: N/A  . Number of Children: 3  . Years of Education: Masters   Occupational History  . Not on file.   Social History Main Topics  . Smoking status:  Never Smoker   . Smokeless tobacco: Never Used  . Alcohol Use: No  . Drug Use: No  . Sexual Activity: Not on file   Other Topics Concern  . Not on file   Social History Narrative   Patient is married with one child.   Patient is left handed.   Patient has her Master's degree.   Patient drinks 2 cups daily.        PHYSICAL EXAM  Filed Vitals:   06/01/15 1607  BP: 126/88  Pulse: 64  Resp: 16  Height: 5' 3.75" (1.619 m)  Weight: 194 lb 6.4 oz (88.179 kg)    Body mass index is 33.64 kg/(m^2).  No exam data present  No flowsheet data found.  GENERAL EXAM: Patient is in no distress; well developed, nourished and groomed; neck is supple.   Pharynx is non-erythematous.  She is tender over the left splenius capitis  muscle and occipital nerve.  NEUROLOGIC: MENTAL STATUS: She is awake, alert, oriented to person, place and time, recent and remote memory intact, normal attention and concentration, language fluent, comprehension intact, naming intact, fund of knowledge appropriate CRANIAL NERVE: extraocular muscles intact, no nystagmus, facial sensation and strength symmetric, hearing intact, palate elevates symmetrically, uvula midline, shoulder shrug symmetric, tongue midline. MOTOR: normal bulk and tone, full strength in the BUE, BLE SENSORY: normal and symmetric to light touch COORDINATION: finger-nose-finger, fine finger movements, REFLEXES: deep tendon reflexes present and symmetric GAIT/STATION: narrow based gait; able to walk on toes, heels and tandem    DIAGNOSTIC DATA (LABS, IMAGING, TESTING) - I reviewed patient records, labs, notes, testing and imaging myself where available. Specifically, her BiPAP machine was interrogated and she was found to have excellent compliance at 100% and an AHI equals 8.1.   ASSESSMENT AND PLAN   Obstructive apnea  Cannot sleep  Other fatigue  Neck pain  New daily persistent headache     1.  Increase BiPAP to 21/17 and increase ramp from 20 minutes to 40 minutes. 2.   Trigger point injection 80 mg Depo-Medrol in 3 cc Marcaine into the left splenius capitis using sterile technique.    She tolerated the procedure well and there were no complications.  3.   Increase zolpidem from 5 to 7.5 mg nightly 4.    rtc 12 months   Follow up in 12 months.   Call sooner if problems.    Britt Bottom M.D. PhD [id number], [id number] PM Certified in Neurology, Sleep Medicine , Neurophysiology and Neuroimaging  [facility] [address] [place], [place] [address] [phone]

## 2015-08-27 ENCOUNTER — Emergency Department (HOSPITAL_BASED_OUTPATIENT_CLINIC_OR_DEPARTMENT_OTHER)
Admission: EM | Admit: 2015-08-27 | Discharge: 2015-08-27 | Disposition: A | Discharge status: Home / Self Care | Payer: BC Managed Care – PPO | Attending: Emergency Medicine | Admitting: Emergency Medicine

## 2015-08-27 ENCOUNTER — Emergency Department (HOSPITAL_BASED_OUTPATIENT_CLINIC_OR_DEPARTMENT_OTHER): Payer: BC Managed Care – PPO

## 2015-08-27 ENCOUNTER — Encounter (HOSPITAL_BASED_OUTPATIENT_CLINIC_OR_DEPARTMENT_OTHER): Payer: Self-pay | Admitting: Emergency Medicine

## 2015-08-27 DIAGNOSIS — Z85520 Personal history of malignant carcinoid tumor of kidney: Secondary | ICD-10-CM | POA: Insufficient documentation

## 2015-08-27 DIAGNOSIS — Z859 Personal history of malignant neoplasm, unspecified: Secondary | ICD-10-CM | POA: Insufficient documentation

## 2015-08-27 DIAGNOSIS — Z79899 Other long term (current) drug therapy: Secondary | ICD-10-CM | POA: Diagnosis not present

## 2015-08-27 DIAGNOSIS — R109 Unspecified abdominal pain: Secondary | ICD-10-CM

## 2015-08-27 DIAGNOSIS — I10 Essential (primary) hypertension: Secondary | ICD-10-CM | POA: Diagnosis not present

## 2015-08-27 DIAGNOSIS — R1010 Upper abdominal pain, unspecified: Secondary | ICD-10-CM | POA: Diagnosis not present

## 2015-08-27 LAB — COMPREHENSIVE METABOLIC PANEL
ALT: 17 U/L (ref 14–54)
AST: 17 U/L (ref 15–41)
Albumin: 4.1 g/dL (ref 3.5–5.0)
Alkaline Phosphatase: 65 U/L (ref 38–126)
Anion gap: 8 (ref 5–15)
BUN: 9 mg/dL (ref 6–20)
CHLORIDE: 106 mmol/L (ref 101–111)
CO2: 27 mmol/L (ref 22–32)
CREATININE: 0.49 mg/dL (ref 0.44–1.00)
Calcium: 9.7 mg/dL (ref 8.9–10.3)
Glucose, Bld: 92 mg/dL (ref 65–99)
POTASSIUM: 3.4 mmol/L — AB (ref 3.5–5.1)
SODIUM: 141 mmol/L (ref 135–145)
Total Bilirubin: 0.5 mg/dL (ref 0.3–1.2)
Total Protein: 7.1 g/dL (ref 6.5–8.1)

## 2015-08-27 LAB — URINALYSIS, ROUTINE W REFLEX MICROSCOPIC
Bilirubin Urine: NEGATIVE
Glucose, UA: NEGATIVE mg/dL
Hgb urine dipstick: NEGATIVE
KETONES UR: NEGATIVE mg/dL
Nitrite: NEGATIVE
PROTEIN: NEGATIVE mg/dL
Specific Gravity, Urine: 1.005 (ref 1.005–1.030)
pH: 7.5 (ref 5.0–8.0)

## 2015-08-27 LAB — CBC WITH DIFFERENTIAL/PLATELET
Basophils Absolute: 0 10*3/uL (ref 0.0–0.1)
Basophils Relative: 0 %
EOS ABS: 0.2 10*3/uL (ref 0.0–0.7)
Eosinophils Relative: 4 %
HCT: 38.3 % (ref 36.0–46.0)
HEMOGLOBIN: 12.8 g/dL (ref 12.0–15.0)
LYMPHS ABS: 1.7 10*3/uL (ref 0.7–4.0)
LYMPHS PCT: 29 %
MCH: 32.5 pg (ref 26.0–34.0)
MCHC: 33.4 g/dL (ref 30.0–36.0)
MCV: 97.2 fL (ref 78.0–100.0)
MONOS PCT: 10 %
Monocytes Absolute: 0.6 10*3/uL (ref 0.1–1.0)
Neutro Abs: 3.3 10*3/uL (ref 1.7–7.7)
Neutrophils Relative %: 57 %
Platelets: 283 10*3/uL (ref 150–400)
RBC: 3.94 MIL/uL (ref 3.87–5.11)
RDW: 12.3 % (ref 11.5–15.5)
WBC: 5.8 10*3/uL (ref 4.0–10.5)

## 2015-08-27 LAB — URINE MICROSCOPIC-ADD ON: RBC / HPF: NONE SEEN RBC/hpf (ref 0–5)

## 2015-08-27 LAB — LIPASE, BLOOD: LIPASE: 29 U/L (ref 11–51)

## 2015-08-27 MED ORDER — DIPHENHYDRAMINE HCL 50 MG/ML IJ SOLN
INTRAMUSCULAR | Status: AC
  Administered 2015-08-27: 25 mg via INTRAVENOUS
  Filled 2015-08-27: qty 1

## 2015-08-27 MED ORDER — DICYCLOMINE HCL 20 MG PO TABS: 20.0000 mg | ORAL_TABLET | Freq: Two times a day (BID) | ORAL | Status: DC

## 2015-08-27 MED ORDER — DICYCLOMINE HCL 10 MG PO CAPS
20.0000 mg | ORAL_CAPSULE | Freq: Once | ORAL | Status: AC
  Administered 2015-08-27: 20 mg via ORAL
  Filled 2015-08-27: qty 2

## 2015-08-27 MED ORDER — EPINEPHRINE 0.3 MG/0.3ML IJ SOAJ
INTRAMUSCULAR | Status: AC
  Filled 2015-08-27: qty 0.3

## 2015-08-27 MED ORDER — DIPHENHYDRAMINE HCL 50 MG/ML IJ SOLN
25.0000 mg | Freq: Once | INTRAMUSCULAR | Status: AC
  Administered 2015-08-27: 25 mg via INTRAVENOUS

## 2015-08-27 MED ORDER — FAMOTIDINE IN NACL 20-0.9 MG/50ML-% IV SOLN
INTRAVENOUS | Status: AC
  Filled 2015-08-27: qty 50

## 2015-08-27 MED ORDER — ONDANSETRON HCL 4 MG/2ML IJ SOLN
4.0000 mg | Freq: Once | INTRAMUSCULAR | Status: AC
  Administered 2015-08-27: 4 mg via INTRAVENOUS
  Filled 2015-08-27: qty 2

## 2015-08-27 MED ORDER — METHYLPREDNISOLONE SODIUM SUCC 125 MG IJ SOLR
INTRAMUSCULAR | Status: AC
  Filled 2015-08-27: qty 2

## 2015-08-27 MED ORDER — IOPAMIDOL (ISOVUE-300) INJECTION 61%
100.0000 mL | Freq: Once | INTRAVENOUS | Status: AC | PRN
  Administered 2015-08-27: 100 mL via INTRAVENOUS

## 2015-08-27 NOTE — ED Notes (Signed)
Middle and lower abd pain x3 days.  Was sent here from [facility] to r/o appendix.

## 2015-08-27 NOTE — ED Notes (Addendum)
MD at bedside.  Ed received call from Radiology that pt was having an allergic reaction to the IV contrast.  Meds pulled by nursing and brought to room.  Pt found to have a dry cough and throat itching.  Airway clear.  Benadryl given per VO MD at bedside.  Other meds returned to med room at this time.

## 2015-08-27 NOTE — ED Notes (Signed)
Pt given d/c instructions. Rx x 1 Verbalizes understanding. No questions. 

## 2015-08-27 NOTE — ED Notes (Signed)
Pt reports dry cough and scratchy throat; denies itching, sob, other concerning symptoms. No hives observed. Respirations regular, even, unlabored.

## 2015-08-27 NOTE — Discharge Instructions (Signed)

## 2015-08-27 NOTE — ED Provider Notes (Signed)
CSN: [id number]     Arrival date & time 08/27/15  1352 History   First MD Initiated Contact with Patient 08/27/15 1430     Chief Complaint  Patient presents with  . Abdominal Pain    (Consider location/radiation/quality/duration/timing/severity/associated sxs/prior Treatment) Patient is a 60 y.o. female presenting with abdominal pain. The history is provided by the patient and medical records. No language interpreter was used.  Abdominal Pain Associated symptoms: diarrhea and nausea   Associated symptoms: no chills, no cough, no dysuria, no fever, no shortness of breath, no vaginal discharge and no vomiting    Palin Oeser is a 60 y.o. female  with a PMH of hernia repair, HTN, HLD, kidney cancer followed by urology with no recurrence who presents to the Emergency Department from urgent care for abdominal pain x 3 days. Patient states that pain initially was located in the center of her abdomen but today moved to her bilateral lower abdomen. Nausea and began today. One loose stool today. No emesis or fever. No medication taken PTA for symptoms. Pain is worse with palpation and certain movements. Better when lying completely still.    Past Medical History  Diagnosis Date  . GERD (gastroesophageal reflux disease)   . Hypertension   . Anxiety   . High cholesterol   . Cancer ([place])   . History of kidney cancer   . Sleep apnea    Past Surgical History  Procedure Laterality Date  . Replacement total knee    . Hernia repair    . Kidney cancer surgery     No family history on file. Social History  Substance Use Topics  . Smoking status: Never Smoker   . Smokeless tobacco: Never Used  . Alcohol Use: No   OB History    No data available     Review of Systems  Constitutional: Negative for fever and chills.  HENT: Negative for congestion.   Eyes: Negative for visual disturbance.  Respiratory: Negative for cough and shortness of breath.   Cardiovascular: Negative.    Gastrointestinal: Positive for nausea, abdominal pain and diarrhea. Negative for vomiting and blood in stool.  Genitourinary: Negative for dysuria and vaginal discharge.  Musculoskeletal: Negative for back pain.  Skin: Negative for rash.  Neurological: Negative for dizziness and headaches.      Allergies  Isovue and Simvastatin  Home Medications   Prior to Admission medications   Medication Sig Start Date End Date Taking? Authorizing Provider  B Complex Vitamins (VITAMIN-B COMPLEX) TABS Reported on 06/01/2015    Historical Provider, MD  escitalopram (LEXAPRO) 20 MG tablet Take 20 mg by mouth.    Historical Provider, MD  Escitalopram Oxalate (LEXAPRO PO) Take by mouth.    Historical Provider, MD  iron polysaccharides (NIFEREX) 150 MG capsule Take by mouth.    Historical Provider, MD  omeprazole (PRILOSEC) 20 MG capsule Take 20 mg by mouth.    Historical Provider, MD  OMEPRAZOLE PO Take by mouth.    Historical Provider, MD  Pravastatin Sodium (PRAVACHOL PO) Take by mouth.    Historical Provider, MD  RIBOFLAVIN PO Reported on 06/01/2015    Historical Provider, MD  VALSARTAN PO Take by mouth.    Historical Provider, MD  valsartan-hydrochlorothiazide (DIOVAN-HCT) 160-12.5 MG per tablet  02/26/13   Historical Provider, MD  zolpidem (AMBIEN) 5 MG tablet Take 1.5 tablets (7.5 mg total) by mouth at bedtime. 06/01/15   Britt Bottom, MD   BP 154/79 mmHg  Pulse 79  Temp(Src)  98.9 F (37.2 C) (Oral)  Resp 16  Ht 5\' 3"  (1.6 m)  Wt 86.637 kg  BMI 33.84 kg/m2  SpO2 100% Physical Exam  Constitutional: She is oriented to person, place, and time. She appears well-developed and well-nourished.  Alert and in no acute distress  HENT:  Head: Normocephalic and atraumatic.  Cardiovascular: Normal rate, regular rhythm and normal heart sounds.  Exam reveals no gallop and no friction rub.   No murmur heard. Pulmonary/Chest: Effort normal and breath sounds normal. No respiratory distress. She has no  wheezes. She has no rales. She exhibits no tenderness.  Abdominal: Soft. Bowel sounds are normal. She exhibits no distension and no mass. There is no rebound, no guarding and negative Murphy's sign.    Tenderness to palpation as depicted in image. No rebound tenderness. No focal tenderness at McBurney's. Negative obturator, negative psoas.   Musculoskeletal: She exhibits no edema.  Neurological: She is alert and oriented to person, place, and time.  Skin: Skin is warm and dry.  Nursing note and vitals reviewed.   ED Course  Procedures (including critical care time) Labs Review Labs Reviewed  URINALYSIS, ROUTINE W REFLEX MICROSCOPIC (NOT AT [facility]) - Abnormal; Notable for the following:    Leukocytes, UA TRACE (*)    All other components within normal limits  COMPREHENSIVE METABOLIC PANEL - Abnormal; Notable for the following:    Potassium 3.4 (*)    All other components within normal limits  URINE MICROSCOPIC-ADD ON - Abnormal; Notable for the following:    Squamous Epithelial / LPF 0-5 (*)    Bacteria, UA FEW (*)    All other components within normal limits  CBC WITH DIFFERENTIAL/PLATELET  LIPASE, BLOOD    Imaging Review No results found. I have personally reviewed and evaluated these images and lab results as part of my medical decision-making.   EKG Interpretation None      MDM  PT NOTIFIED THAT THE CT SCAN SHOWED NOTHING ACUTE.  PT'S SX AFTER THE BENADRYL HAVE RESOLVED.  PT TOLD TO RETURN IF WORSE. Final diagnoses:  Abdominal pain   Ronnica Mollett presents to ED sent from urgent care for concern of appendicitis. Patient complaining of abdominal pain x 3 days. On exam, nonsurgical abdomen. TTP of lower abdomen. CBC, CMP, lipase, UA reviewed and reassuring. After receiving IV contrast patient admitted to her throat itching and dry cough. At this time, patient was evaluated by attending, Dr. Gilford Raid - benadryl given. Airway patent and breath sounds clear.   CT abdomen  pending at shift change. Case discussed with Dr. Gilford Raid who will follow up on CT results and dispo appropriately.      [facility] Ward, PA-C 08/27/15 1727  Isla Pence, MD 08/27/15 Karl Bales

## 2015-09-23 ENCOUNTER — Encounter: Payer: Self-pay | Admitting: Neurology

## 2015-12-15 ENCOUNTER — Telehealth: Payer: Self-pay | Admitting: Neurology

## 2015-12-15 MED ORDER — ZOLPIDEM TARTRATE 5 MG PO TABS: 7.5000 mg | ORAL_TABLET | Freq: Every day | ORAL | 5 refills | Status: DC

## 2015-12-15 NOTE — Telephone Encounter (Signed)
RAS is ooo, Rx. awaiting YY's sig/fim

## 2015-12-15 NOTE — Telephone Encounter (Signed)
Patient called to request refill of zolpidem (AMBIEN) 5 MG tablet

## 2015-12-16 NOTE — Telephone Encounter (Signed)
Ambien rx. faxed to [facility] fax# [phone]/fim

## 2016-01-23 ENCOUNTER — Telehealth: Payer: Self-pay | Admitting: Neurology

## 2016-01-23 MED ORDER — ZOLPIDEM TARTRATE 10 MG PO TABS: 10.0000 mg | ORAL_TABLET | Freq: Every evening | ORAL | 0 refills | Status: DC | PRN

## 2016-01-23 NOTE — Telephone Encounter (Signed)
I have spoken with Karen Cannon this afternoon, and per RAS, advised ok for Ambien 10mg  po qhs.  Will fax rx. to [facility].  She verbalized understanding of same.  Rx. awaiting RAS sig/fim

## 2016-01-23 NOTE — Addendum Note (Signed)
Addended by: France Ravens I on: 01/23/2016 04:49 PM   Modules accepted: Orders

## 2016-01-23 NOTE — Telephone Encounter (Signed)
Patient called to advise, [facility] doesn't want to pay for 45 tablets of zolpidem (AMBIEN) 5 MG tablet, asks if she should go up to 30 tablets of 10 MG? Otherwise it will be $67 per month. Please call to advise [phone].

## 2016-01-24 NOTE — Telephone Encounter (Signed)
Ambien rx. faxed to [facility]/fim 

## 2016-02-21 ENCOUNTER — Other Ambulatory Visit: Payer: Self-pay | Admitting: Neurology

## 2016-05-31 ENCOUNTER — Ambulatory Visit (INDEPENDENT_AMBULATORY_CARE_PROVIDER_SITE_OTHER): Payer: BC Managed Care – PPO | Admitting: Neurology

## 2016-05-31 ENCOUNTER — Encounter: Payer: Self-pay | Admitting: Neurology

## 2016-05-31 DIAGNOSIS — R413 Other amnesia: Secondary | ICD-10-CM

## 2016-05-31 DIAGNOSIS — G4700 Insomnia, unspecified: Secondary | ICD-10-CM | POA: Diagnosis not present

## 2016-05-31 DIAGNOSIS — G4733 Obstructive sleep apnea (adult) (pediatric): Secondary | ICD-10-CM | POA: Diagnosis not present

## 2016-05-31 DIAGNOSIS — G4489 Other headache syndrome: Secondary | ICD-10-CM

## 2016-05-31 MED ORDER — ZOLPIDEM TARTRATE 10 MG PO TABS: 10.0000 mg | ORAL_TABLET | Freq: Every evening | ORAL | 5 refills | Status: DC | PRN

## 2016-05-31 NOTE — Progress Notes (Signed)
[facility]  PATIENT: Karen Cannon DOB: 03-26-1956  REFERRING CLINICIAN: Dr. Jefm Petty HISTORY FROM: Patient REASON FOR VISIT: Obstructive sleep apnea   Chief Complaint    Sleep Apnea      HISTORICAL  CHIEF COMPLAINT:  Obstructive sleep apnea, Insomnia, memory loss, headaches  HISTORY OF PRESENT ILLNESS:  She is a 61 year old woman with severe obstructive sleep apnea with some central sleep apneas. She is on BiPAP 21/15.   She still notres insomnia and she feels unrefreshed when she wakes up .   Download shows that she has excellent compliance with 30/30 days of use for more than 4 hours. Her average night was just under 9 hours. The AHI was 9.2 (was 8.9 last time).   She does better with a 40 minute ramp (was 20 minutes).  She had severe OSA before being placed on BiPAP. She denies any excessive daytime sleepiness but this was never a problem.      She takes the BiPAP with her wherever she travels.   Over the past few months, she notes more problems with feeling less refreshed when she wakes up.   Epworth Sleepiness Scale score is 0.  She has insomnia with more difficulty falling asleep than staying asleep. Zolpidem 7.5 mg helps more some nights than other nights. 10 mg helped more.  She tolerates it well. She has not had any unusual reaction to the zolpidem.    She has a set routine at bedtime and takes Ambien 30-45 minutes before bedtime as that helps best.  Headaches are occurring almost every day. A splenius capitis / occipital nerve block did not help more than a couple of days.     EPWORTH SLEEPINESS SCALE  On a scale of 0 - 3 what is the chance of dozing:  Sitting and Reading:   0 Watching TV:    0 Sitting inactive in a public place: 0 Passenger in car for one hour: 0 Lying down to rest in the afternoon: 0 Sitting and talking to someone: 0 Sitting quietly after lunch:  0 In a car, stopped in traffic:  0  Total (out of 24):    0/24   She  sometimes notes mild memory issues.  As an example she is not sure why she she is looking in a cabinet.   She sometimes has trouble coming up with the right words.    Her father had AD at age 65.       REVIEW OF SYSTEMS: Full 14 system review of systems performed and notable only for insomnia. She reports headaches  ALLERGIES: Allergies  Allergen Reactions  . Isovue [Iopamidol] Itching    Throat itching, tightness  . Simvastatin     cramps    HOME MEDICATIONS: Outpatient Medications Prior to Visit  Medication Sig Dispense Refill  . B Complex Vitamins (VITAMIN-B COMPLEX) TABS Reported on 06/01/2015    . dicyclomine (BENTYL) 20 MG tablet Take 1 tablet (20 mg total) by mouth 2 (two) times daily. 20 tablet 0  . escitalopram (LEXAPRO) 20 MG tablet Take 20 mg by mouth.    . Escitalopram Oxalate (LEXAPRO PO) Take by mouth.    . iron polysaccharides (NIFEREX) 150 MG capsule Take by mouth.    Marland Kitchen omeprazole (PRILOSEC) 20 MG capsule Take 20 mg by mouth.    . OMEPRAZOLE PO Take by mouth.    . Pravastatin Sodium (PRAVACHOL PO) Take by mouth.    . VALSARTAN PO Take by mouth.    Marland Kitchen  valsartan-hydrochlorothiazide (DIOVAN-HCT) 160-12.5 MG per tablet     . zolpidem (AMBIEN) 10 MG tablet TAKE 1 TABLET BY MOUTH AT BEDTIME AS NEEDED FOR SLEEP 30 tablet 2  . RIBOFLAVIN PO Reported on 06/01/2015     No facility-administered medications prior to visit.     PAST MEDICAL HISTORY: Past Medical History:  Diagnosis Date  . Anxiety   . Cancer ([place])   . GERD (gastroesophageal reflux disease)   . High cholesterol   . History of kidney cancer   . Hypertension   . Sleep apnea     PAST SURGICAL HISTORY: Past Surgical History:  Procedure Laterality Date  . HERNIA REPAIR    . kidney cancer surgery    . REPLACEMENT TOTAL KNEE      FAMILY HISTORY: She denies a history of OSA in family members.  SOCIAL HISTORY:  Social History   Social History  . Marital status: Married    Spouse name: N/A  .  Number of children: 3  . Years of education: Masters   Occupational History  . Not on file.   Social History Main Topics  . Smoking status: Never Smoker  . Smokeless tobacco: Never Used  . Alcohol use No  . Drug use: No  . Sexual activity: Yes    Birth control/ protection: Post-menopausal   Other Topics Concern  . Not on file   Social History Narrative   Patient is married with one child.   Patient is left handed.   Patient has her Master's degree.   Patient drinks 2 cups daily.        PHYSICAL EXAM  There were no vitals filed for this visit.  There is no height or weight on file to calculate BMI.  No exam data present  No flowsheet data found.  GENERAL EXAM: Patient is in no distress; well developed, nourished and groomed; neck is supple.   Pharynx is non-erythematous.  She is tender over the left splenius capitis muscle and occipital nerve.  NEUROLOGIC: MENTAL STATUS: She is awake, alert, oriented to person, place and time, recent and remote memory intact, normal attention and concentration, language fluent, comprehension intact, naming intact, fund of knowledge appropriate CRANIAL NERVE: extraocular muscles intact, no nystagmus, facial sensation and strength symmetric, hearing intact, palate elevates symmetrically, uvula midline, shoulder shrug symmetric, tongue midline. MOTOR: Muscle bulk and tone are normal. Strength is normal in the arms and legs. SENSORY: she has normal and symmetric sensation to light touch COORDINATION: finger-nose-finger, fine finger movements, REFLEXES: deep tendon reflexes normal and symmetric GAIT/STATION: Station is normal. Her gait is normal. She is able to tandem walk.    DIAGNOSTIC DATA (LABS, IMAGING, TESTING) - I reviewed patient records, labs, notes, testing and imaging myself where available. Specifically, her BiPAP machine was interrogated and she was found to have excellent compliance at 100% and an AHI equals  8.1.   ASSESSMENT AND PLAN   Obstructive apnea  Insomnia, unspecified type  Other headache syndrome  Memory loss    1.  Continue BiPAP to 21/17 2.   increase zolpidem from 7.5 mg to 10 mgnightly 3.   We discussed her mild memory symptoms. She appears to have some decreased focus of this is more likely to be due to insomnia than to a primary memory disorder. 4.   rtc 6 months    Britt Bottom M.D. PhD [id number], [id number] PM Certified in Neurology, Sleep Medicine , Neurophysiology and Neuroimaging  [facility] [address]  [place], [place] [address] [phone]

## 2016-11-27 ENCOUNTER — Encounter: Payer: Self-pay | Admitting: Neurology

## 2016-11-27 ENCOUNTER — Ambulatory Visit (INDEPENDENT_AMBULATORY_CARE_PROVIDER_SITE_OTHER): Payer: BC Managed Care – PPO | Admitting: Neurology

## 2016-11-27 ENCOUNTER — Encounter (INDEPENDENT_AMBULATORY_CARE_PROVIDER_SITE_OTHER): Payer: Self-pay

## 2016-11-27 VITALS — BP 129/77 | HR 86 | Resp 16 | Ht 63.0 in | Wt 184.5 lb

## 2016-11-27 DIAGNOSIS — G4485 Primary stabbing headache: Secondary | ICD-10-CM | POA: Diagnosis not present

## 2016-11-27 DIAGNOSIS — G4700 Insomnia, unspecified: Secondary | ICD-10-CM

## 2016-11-27 DIAGNOSIS — G4733 Obstructive sleep apnea (adult) (pediatric): Secondary | ICD-10-CM

## 2016-11-27 DIAGNOSIS — E669 Obesity, unspecified: Secondary | ICD-10-CM

## 2016-11-27 MED ORDER — ZOLPIDEM TARTRATE 10 MG PO TABS: 10.0000 mg | ORAL_TABLET | Freq: Every evening | ORAL | 2 refills | Status: DC | PRN

## 2016-11-27 MED ORDER — TRAZODONE HCL 100 MG PO TABS: ORAL_TABLET | ORAL | 3 refills | Status: DC

## 2016-11-27 NOTE — Progress Notes (Signed)
[facility]  PATIENT: Karen Cannon DOB: [birth year]  REFERRING CLINICIAN: Dr. Jefm Petty HISTORY FROM: Patient REASON FOR VISIT: Obstructive sleep apnea   Chief Complaint    Sleep Apnea      HISTORICAL  CHIEF COMPLAINT:  Obstructive sleep apnea, Insomnia, memory loss, headaches  HISTORY OF PRESENT ILLNESS:  She is a 61 year old woman with severe obstructive sleep apnea with some central sleep apneas. She is on Auto BiPAP with typical BiPAP 21/17.   Download shows that she has excellent compliance with 30/30 days of use for more than 4 hours. Her average night was just under 9 hours. The AHI was 12.5. At the last visit the AHI was 9.2. She has a 40 minute ramp but still sometimes has to reset the ramp due to the pressure being too high.   She had severe OSA before being placed on BiPAP.  She denies any excessive daytime sleepiness but this was never a problem.      She takes the BiPAP with her wherever she travels.    Epworth Sleepiness Scale score is 0.  She has trouble falling asleep but once asleep usually sleeps well.   She wakes up somewhat refreshed in the mornings. She is on Ambien 10 mg of melatonin nightly.  She still has difficulty falling asleep but once asleep well usually sleep well..    She has a set routine at bedtime and takes Ambien 30-45 minutes before bedtime as that helps best.   She has never had any problems with side effects from Ambien.  Headaches are occurring almost every day, usually in the evening. A splenius capitis / occipital nerve block did not help more than a couple of days.     She has noted some difficulty with memory at times. Sometimes she has trouble coming up with the right words. This has not worsened over the last year or 2.     Her father had AD at age 46.       REVIEW OF SYSTEMS: Full 14 system review of systems performed and notable only for insomnia. She reports headaches  ALLERGIES: Allergies  Allergen  Reactions  . Isovue [Iopamidol] Itching    Throat itching, tightness  . Simvastatin     cramps    HOME MEDICATIONS: Outpatient Medications Prior to Visit  Medication Sig Dispense Refill  . B Complex Vitamins (VITAMIN-B COMPLEX) TABS Reported on 06/01/2015    . dicyclomine (BENTYL) 20 MG tablet Take 1 tablet (20 mg total) by mouth 2 (two) times daily. 20 tablet 0  . escitalopram (LEXAPRO) 20 MG tablet Take 20 mg by mouth.    . Escitalopram Oxalate (LEXAPRO PO) Take by mouth.    . iron polysaccharides (NIFEREX) 150 MG capsule Take by mouth.    . Pravastatin Sodium (PRAVACHOL PO) Take by mouth.    Marland Kitchen RIBOFLAVIN PO Reported on 06/01/2015    . VALSARTAN PO Take by mouth.    . valsartan-hydrochlorothiazide (DIOVAN-HCT) 160-12.5 MG per tablet     . zolpidem (AMBIEN) 10 MG tablet Take 1 tablet (10 mg total) by mouth at bedtime as needed. 30 tablet 5  . omeprazole (PRILOSEC) 20 MG capsule Take 20 mg by mouth.    . OMEPRAZOLE PO Take by mouth.     No facility-administered medications prior to visit.     PAST MEDICAL HISTORY: Past Medical History:  Diagnosis Date  . Anxiety   . Cancer ([place])   . GERD (gastroesophageal reflux disease)   .  High cholesterol   . History of kidney cancer   . Hypertension   . Sleep apnea     PAST SURGICAL HISTORY: Past Surgical History:  Procedure Laterality Date  . HERNIA REPAIR    . kidney cancer surgery    . REPLACEMENT TOTAL KNEE      FAMILY HISTORY: She denies a history of OSA in family members.  SOCIAL HISTORY:  Social History   Social History  . Marital status: Married    Spouse name: N/A  . Number of children: 3  . Years of education: Masters   Occupational History  . Not on file.   Social History Main Topics  . Smoking status: Never Smoker  . Smokeless tobacco: Never Used  . Alcohol use No  . Drug use: No  . Sexual activity: Yes    Birth control/ protection: Post-menopausal   Other Topics Concern  . Not on file   Social  History Narrative   Patient is married with one child.   Patient is left handed.   Patient has her Master's degree.   Patient drinks 2 cups daily.        PHYSICAL EXAM  Vitals:   11/27/16 1538  BP: 129/77  Pulse: 86  Resp: 16  Weight: 184 lb 8 oz (83.7 kg)  Height: 5\' 3"  (1.6 m)    Body mass index is 32.68 kg/m.  No exam data present  No flowsheet data found.  GENERAL EXAM: Patient is in no distress; well developed, nourished and groomed; neck is supple.   Pharynx is non-erythematous.  She is tender over the left splenius capitis muscle and occipital nerve.  NEUROLOGIC: MENTAL STATUS: She is awake, alert, oriented to person, place and time, recent and remote memory intact, normal attention and concentration, language fluent, comprehension intact, naming intact, fund of knowledge appropriate CRANIAL NERVE: extraocular muscles intact, no nystagmus, facial sensation and strength symmetric, hearing intact, palate elevates symmetrically, uvula midline, shoulder shrug symmetric, tongue midline. MOTOR: Muscle bulk and tone are normal. Strength is normal in the arms and legs. SENSORY: she has normal and symmetric sensation to light touch COORDINATION: finger-nose-finger, fine finger movements, REFLEXES: deep tendon reflexes normal and symmetric GAIT/STATION: Station is normal. Her gait is normal. She is able to tandem walk.    DIAGNOSTIC DATA (LABS, IMAGING, TESTING)    ASSESSMENT AND PLAN   Obstructive sleep apnea syndrome  Insomnia, unspecified type  Obesity (BMI 30.0-34.9)  Headache, primary stabbing    1.  Continue BiPAP to 21/17 2.  Continue zolpidem at 10 mg nightly and add trazodone 50-100 mg nightly. If this combination does not help, consider Lunesta 3 mg nightly. 3.   Stay active, exercise and try to lose weight. 4.   rtc 6 months    Britt Bottom M.D. PhD 0/01/9322, 5:57 PM Certified in Neurology, Sleep Medicine , Neurophysiology and  Neuroimaging  [facility] [address] [place], [place] [address] [phone]

## 2016-11-27 NOTE — Addendum Note (Signed)
Addended by: Arlice Colt A on: 11/27/2016 04:54 PM   Modules accepted: Level of Service

## 2017-05-30 ENCOUNTER — Ambulatory Visit: Payer: Self-pay | Admitting: Neurology

## 2017-06-24 ENCOUNTER — Telehealth: Payer: Self-pay | Admitting: Neurology

## 2017-06-24 NOTE — Telephone Encounter (Signed)
I called and spoke with patient and made her aware that she needs to contact her DME company and make them aware of the problems she is having with her machine. She voiced understanding.

## 2017-06-24 NOTE — Telephone Encounter (Signed)
Pt called stating her Bi Pap isnt working right. Pt feels the air isnt where she needs it to be and he husband still hears her snoring. Pt requesting a call back to discuss

## 2017-07-03 ENCOUNTER — Other Ambulatory Visit: Payer: Self-pay | Admitting: Neurology

## 2017-07-17 ENCOUNTER — Ambulatory Visit: Payer: BC Managed Care – PPO | Admitting: Neurology

## 2017-07-17 ENCOUNTER — Other Ambulatory Visit: Payer: Self-pay

## 2017-07-17 ENCOUNTER — Encounter: Payer: Self-pay | Admitting: Neurology

## 2017-07-17 VITALS — BP 111/71 | HR 70 | Resp 16 | Ht 63.0 in | Wt 178.0 lb

## 2017-07-17 DIAGNOSIS — G4489 Other headache syndrome: Secondary | ICD-10-CM | POA: Diagnosis not present

## 2017-07-17 DIAGNOSIS — G4700 Insomnia, unspecified: Secondary | ICD-10-CM

## 2017-07-17 DIAGNOSIS — G4733 Obstructive sleep apnea (adult) (pediatric): Secondary | ICD-10-CM | POA: Diagnosis not present

## 2017-07-17 MED ORDER — ZOLPIDEM TARTRATE 10 MG PO TABS: 10.0000 mg | ORAL_TABLET | Freq: Every evening | ORAL | 1 refills | Status: DC | PRN

## 2017-07-17 NOTE — Addendum Note (Signed)
Addended by: France Ravens I on: 07/17/2017 03:56 PM   Modules accepted: Orders

## 2017-07-17 NOTE — Progress Notes (Signed)
[facility]  PATIENT: Karen Cannon DOB: April 20, 1955  REFERRING CLINICIAN: Dr. Jefm Petty HISTORY FROM: Patient REASON FOR VISIT: Obstructive sleep apnea   Chief Complaint    Sleep Apnea; Insomnia      HISTORICAL  CHIEF COMPLAINT:  Chief Complaint  Patient presents with  . Sleep Apnea  . Insomnia    HISTORY OF PRESENT ILLNESS:  She is a 62 year old woman with severe obstructive sleep apnea with some central sleep apneas.   Update 07/17/2017: She tolerated BiPAP well and use sit nightly. .    She is on auto-BiPAP with a maximum pressure of 21 cm.  Compliance is excellent at 100%.  SHe uses BiPAP at an average of 8 hours and 54 minutes a day.  The AHI is good at 5.6/hr.    Before being placed on BiPAP, she had severe OSA.    She denies any problems with excessive daytime sleepiness.  She was having some issues with sleep onset insomnia.  Ambien 10 mg,  Trazodone 50 mg, and melatonin help her.  Once asleep she stays asleep.   Every now and then she wakes up and has trouble falling asleep.  She feels the headaches are not much of a problem.    She sometimes notes some memory issues but this is stable and usually not a problem.      A few months ago, she had shaking in her hands.   This is better now.     From 11/27/2017: She is on Auto BiPAP with typical BiPAP 21/17.   Download shows that she has excellent compliance with 30/30 days of use for more than 4 hours. Her average night was just under 9 hours. The AHI was 12.5. At the last visit the AHI was 9.2. She has a 40 minute ramp but still sometimes has to reset the ramp due to the pressure being too high.   She had severe OSA before being placed on BiPAP.  She denies any excessive daytime sleepiness but this was never a problem.      She takes the BiPAP with her wherever she travels.    Epworth Sleepiness Scale score is 0.  She has trouble falling asleep but once asleep usually sleeps well.   She wakes up  somewhat refreshed in the mornings. She is on Ambien 10 mg of melatonin nightly.  She still has difficulty falling asleep but once asleep well usually sleep well..    She has a set routine at bedtime and takes Ambien 30-45 minutes before bedtime as that helps best.   She has never had any problems with side effects from Ambien.  Headaches are occurring almost every day, usually in the evening. A splenius capitis / occipital nerve block did not help more than a couple of days.     She has noted some difficulty with memory at times. Sometimes she has trouble coming up with the right words. This has not worsened over the last year or 2.     Her father had AD at age 30.       REVIEW OF SYSTEMS: Full 14 system review of systems performed and notable only for insomnia. She reports headaches  ALLERGIES: Allergies  Allergen Reactions  . Isovue [Iopamidol] Itching    Throat itching, tightness  . Simvastatin     cramps    HOME MEDICATIONS: Outpatient Medications Prior to Visit  Medication Sig Dispense Refill  . B Complex Vitamins (VITAMIN-B COMPLEX) TABS Reported on 06/01/2015    .  escitalopram (LEXAPRO) 20 MG tablet Take 20 mg by mouth.    . Escitalopram Oxalate (LEXAPRO PO) Take by mouth.    . iron polysaccharides (NIFEREX) 150 MG capsule Take by mouth.    . Pravastatin Sodium (PRAVACHOL PO) Take by mouth.    . ranitidine (ZANTAC) 150 MG capsule     . RIBOFLAVIN PO Reported on 06/01/2015    . traZODone (DESYREL) 100 MG tablet 1/2 to 1 pill nightly po 90 tablet 3  . VALSARTAN PO Take by mouth.    . valsartan-hydrochlorothiazide (DIOVAN-HCT) 160-12.5 MG per tablet     . zolpidem (AMBIEN) 10 MG tablet TAKE ONE TABLET BY MOUTH EVERY NIGHT AT BEDTIME AS NEEDED 90 tablet 0  . dicyclomine (BENTYL) 20 MG tablet Take 1 tablet (20 mg total) by mouth 2 (two) times daily. 20 tablet 0   No facility-administered medications prior to visit.     PAST MEDICAL HISTORY: Past Medical History:  Diagnosis  Date  . Anxiety   . Cancer ([place])   . GERD (gastroesophageal reflux disease)   . High cholesterol   . History of kidney cancer   . Hypertension   . Sleep apnea     PAST SURGICAL HISTORY: Past Surgical History:  Procedure Laterality Date  . HERNIA REPAIR    . kidney cancer surgery    . REPLACEMENT TOTAL KNEE      FAMILY HISTORY: She denies a history of OSA in family members.  SOCIAL HISTORY:  Social History   Socioeconomic History  . Marital status: Married    Spouse name: Not on file  . Number of children: 3  . Years of education: Masters  . Highest education level: Not on file  Occupational History  . Not on file  Social Needs  . Financial resource strain: Not on file  . Food insecurity:    Worry: Not on file    Inability: Not on file  . Transportation needs:    Medical: Not on file    Non-medical: Not on file  Tobacco Use  . Smoking status: Never Smoker  . Smokeless tobacco: Never Used  Substance and Sexual Activity  . Alcohol use: No    Alcohol/week: 0.0 oz  . Drug use: No  . Sexual activity: Yes    Birth control/protection: Post-menopausal  Lifestyle  . Physical activity:    Days per week: Not on file    Minutes per session: Not on file  . Stress: Not on file  Relationships  . Social connections:    Talks on phone: Not on file    Gets together: Not on file    Attends religious service: Not on file    Active member of club or organization: Not on file    Attends meetings of clubs or organizations: Not on file    Relationship status: Not on file  . Intimate partner violence:    Fear of current or ex partner: Not on file    Emotionally abused: Not on file    Physically abused: Not on file    Forced sexual activity: Not on file  Other Topics Concern  . Not on file  Social History Narrative   Patient is married with one child.   Patient is left handed.   Patient has her Master's degree.   Patient drinks 2 cups daily.     PHYSICAL  EXAM  Vitals:   07/17/17 1506  BP: 111/71  Pulse: 70  Resp: 16  Weight: 178 lb (80.7  kg)  Height: 5\' 3"  (1.6 m)    Body mass index is 31.53 kg/m.  General: The patient is well-developed and well-nourished and in no acute distress   Neurologic Exam  Mental status: The patient is alert and oriented x 3 at the time of the examination. The patient has apparent normal recent and remote memory, with an apparently normal attention span and concentration ability.   Speech is normal.  Cranial nerves: Extraocular movements are full.     There is good facial sensation to soft touch bilaterally.Facial strength is normal.  Trapezius and sternocleidomastoid strength is normal. No dysarthria is noted.  The tongue is midline, and the patient has symmetric elevation of the soft palate. No obvious hearing deficits are noted.  Motor:  Muscle bulk is normal.   Tone is normal. Strength is  5 / 5 in all 4 extremities.    Coordination: Cerebellar testing reveals good finger-nose-finger bilaterally.  Gait and station: Station is normal.   Gait is normal. Tandem gait is normal.    Reflexes: Deep tendon reflexes are symmetric and normal bilaterally.      DIAGNOSTIC DATA (LABS, IMAGING, TESTING)    ASSESSMENT AND PLAN   Obstructive apnea  Insomnia, unspecified type  Other headache syndrome    1.  Continue BiPAP to 21/17.   Her BiPAP is 63 years old.     We will see if she is eligible for another one (she uses [facility]) 2.  Continue zolpidem at 10 mg nightly and add trazodone 50-100 mg nightly.  3.   Stay active, exercise and try to lose weight. 4.   rtc 6 months    Britt Bottom M.D. PhD 11/25/2991, 7:16 PM Certified in Neurology, Sleep Medicine , Neurophysiology and Neuroimaging  [facility] [address], [place],  [address] [phone]

## 2017-07-18 ENCOUNTER — Telehealth: Payer: Self-pay | Admitting: Neurology

## 2017-07-18 DIAGNOSIS — G4733 Obstructive sleep apnea (adult) (pediatric): Secondary | ICD-10-CM

## 2017-07-18 NOTE — Telephone Encounter (Signed)
Spoke with Barbaraann Rondo and clarified order/fim

## 2017-07-18 NOTE — Telephone Encounter (Signed)
Rodney/[facility] [phone] alled the air pressure sent in was 8-21 and that is cpap setting not bipap. He needs new bipap setting. Please call for clarification.

## 2017-07-22 NOTE — Telephone Encounter (Addendum)
Karen Cannon with [facility] is calling to discuss Bipap pressure. He can be reached at [phone].

## 2017-07-22 NOTE — Telephone Encounter (Signed)
Spoke with Barbaraann Rondo.  Physician order for autobipap was faxed to [facility] last week.  I am not sure what more they need and have asked that they fax order for RAS to sign, which he sts. he will do/fim

## 2017-08-02 NOTE — Telephone Encounter (Signed)
Spoke with pt. and explained I have faxed orders for BiPAP settings twice to [place] [facility].  The last time I spoke with him, he stated these were still not the orders he needed, and I asked him to fax me what he needs so that I can better understand, and RAS is happy to sign these orders.  I have not received this fax from [facility].  I will call Barbaraann Rondo again today and request fax detailing what orders he needs.  She verbalized understanding of same/fim

## 2017-08-02 NOTE — Telephone Encounter (Signed)
Spoke with Karen Cannon and let her know that I received a fax from [place] at [facility] this am with the orders he is requesting.  New order signed and faxed back to him, so she shouldn't have any more problems./fim

## 2017-08-02 NOTE — Telephone Encounter (Signed)
Pt called she has spoken with [facility] and they said the order from physician did not have settings for autobipap. Please resend the order.  Pt is aware the clinic closes at noon today

## 2017-08-02 NOTE — Telephone Encounter (Signed)
I lmom (identified vm) for Karen Cannon at [facility], requesting fax detailing the orders he needs for pt's BiPap machine/fim

## 2017-08-02 NOTE — Addendum Note (Signed)
Addended by: France Ravens I on: 08/02/2017 12:34 PM   Modules accepted: Orders

## 2017-09-22 ENCOUNTER — Encounter: Payer: Self-pay | Admitting: Neurology

## 2017-09-27 IMAGING — CT CT ABD-PELV W/ CM
2 of 5 series · 15 of 46 positions shown, 17 images · IV contrast (APPLIED)
Comparison: CT abdomen dated 05/26/2015 and CT abdomen dated
10/10/2010.

CLINICAL DATA: Lower abdominal pain and tenderness, worsening for
the past 3 days.

History of renal cancer with partial nephrectomy.
EXAM:
CT ABDOMEN AND PELVIS WITH CONTRAST
TECHNIQUE: Multidetector CT imaging of the abdomen and pelvis was performed
using the standard protocol following bolus administration of
intravenous contrast.
CONTRAST:  100mL CRI05Y-Q00 IOPAMIDOL (CRI05Y-Q00) INJECTION 61%

[Series 2: axial st · axial · 0.98mm/px · z∈[-454,-34]mm · 12 of 96 slices shown, 14 images]
[im 6/96  soft-tissue]
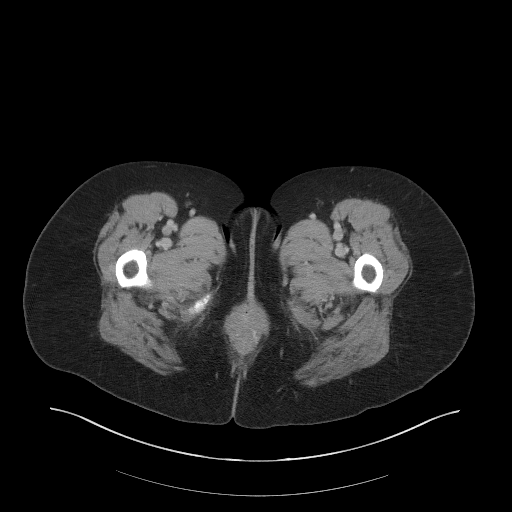
[im 6/96  bone]
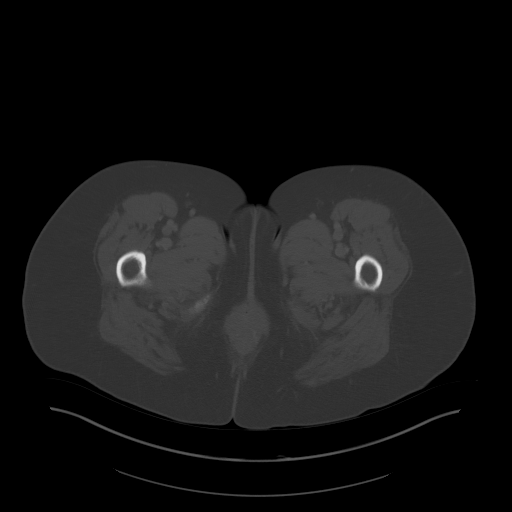
[im 16/96  soft-tissue]
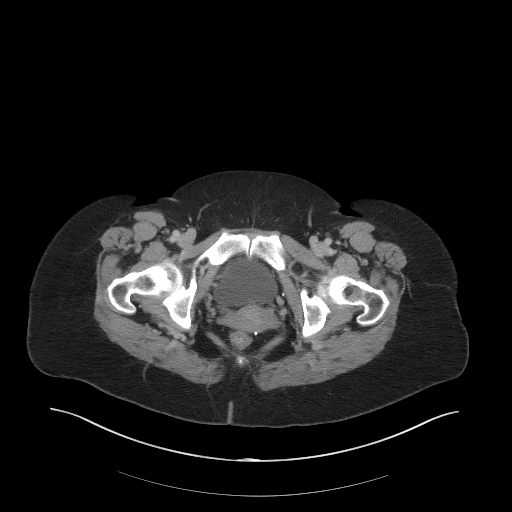
[im 22/96  soft-tissue]
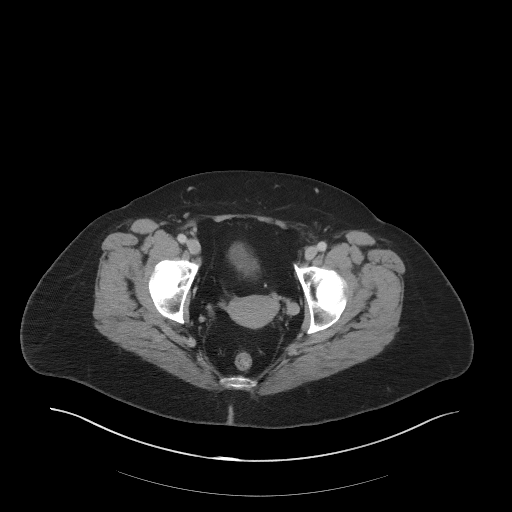
[im 27/96  soft-tissue]
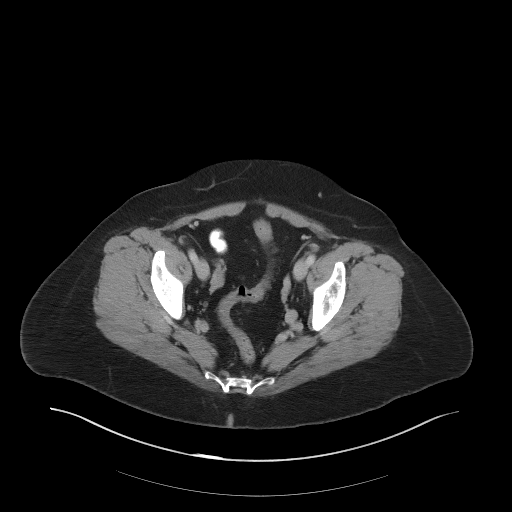
[im 37/96  soft-tissue]
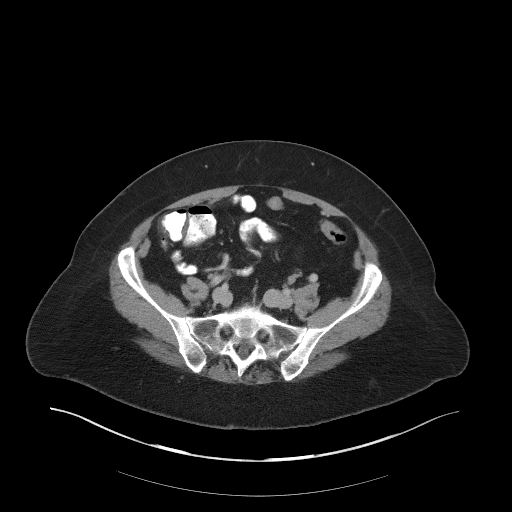
[im 43/96  soft-tissue]
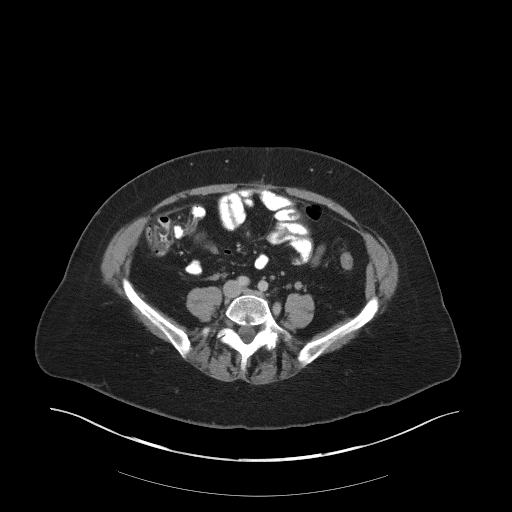
[im 53/96  soft-tissue]
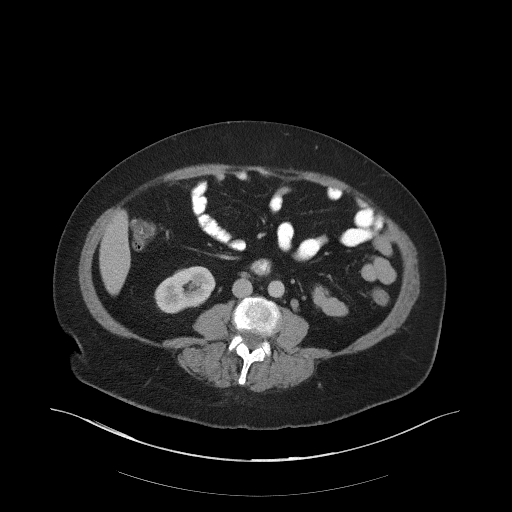
[im 59/96  soft-tissue]
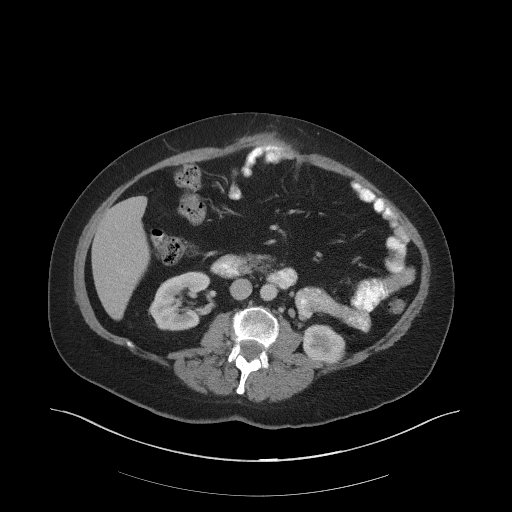
[im 69/96  soft-tissue]
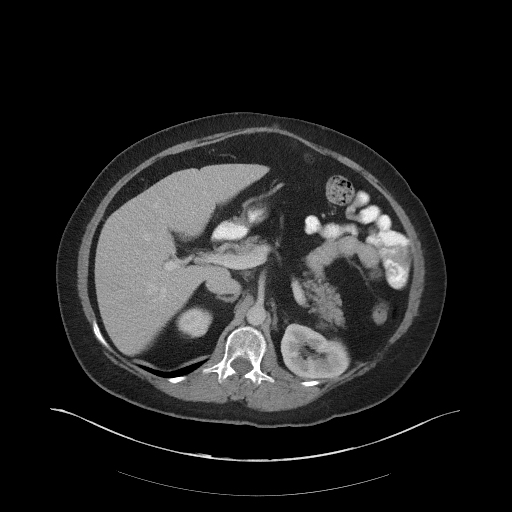
[im 69/96  bone]
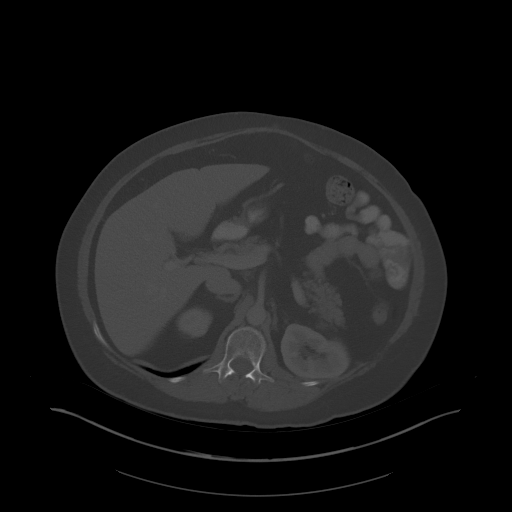
[im 74/96  soft-tissue]
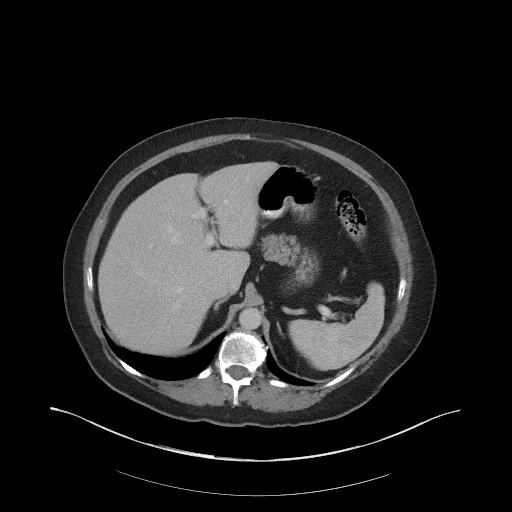
[im 80/96  soft-tissue]
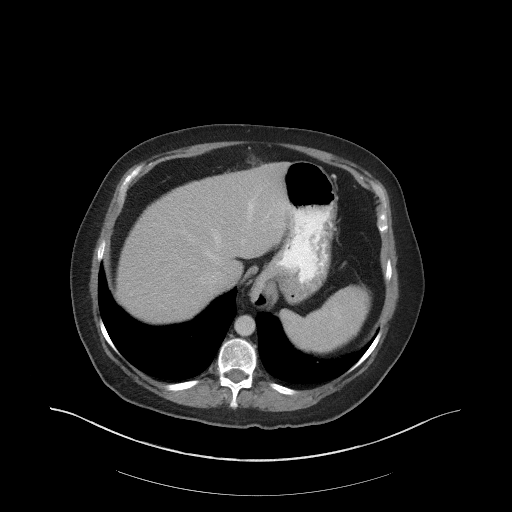
[im 90/96  soft-tissue]
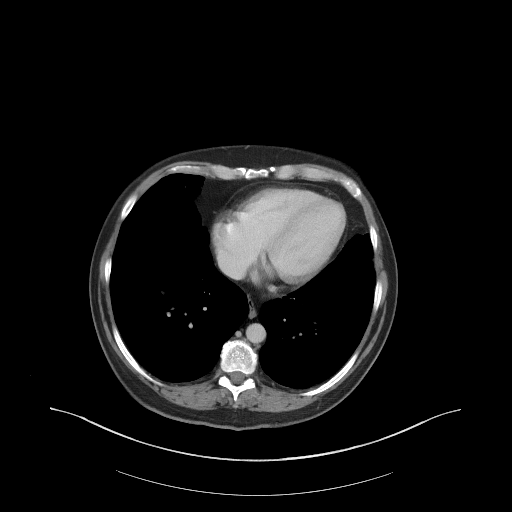

[Series 5: coronal st · coronal · 0.87mm/px · 3 of 97 slices shown]
[im 33/97  soft-tissue]
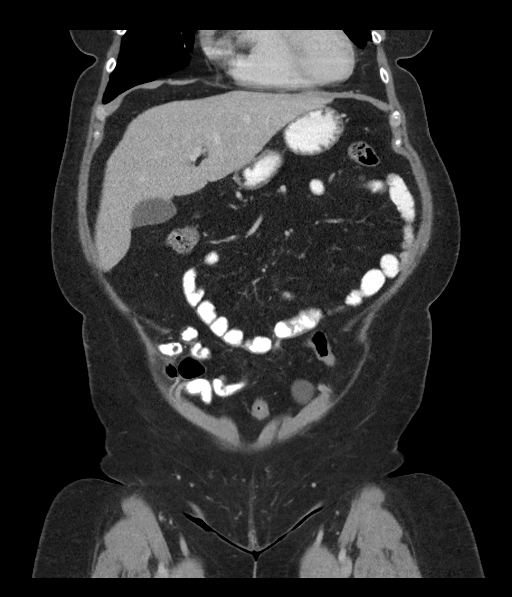
[im 43/97  soft-tissue]
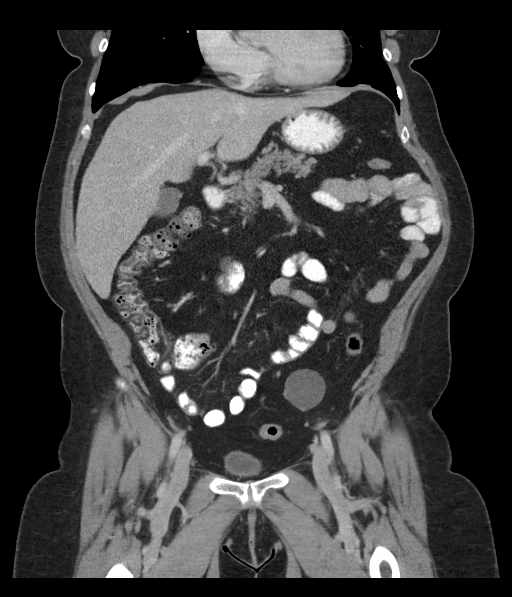
[im 54/97  soft-tissue]
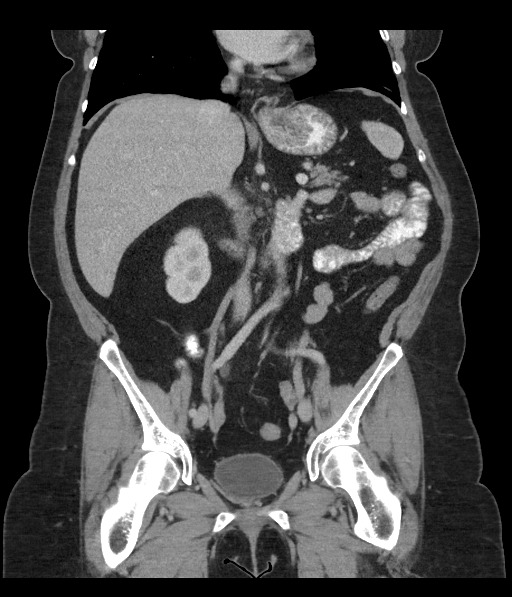

[15 of 46 positions shown; findings below may reference images not displayed]

FINDINGS: Lower chest:  No acute findings.

Hepatobiliary: The small enhancing lesion seen within segment 4A of
the left liver lobe on previous CT of 05/26/2015 is not seen on
today's exam, compatible with previous report description of a
probable perfusion anomaly. Liver appears normal on today's exam.

Gallbladder appears normal.  No bile duct dilatation.

Pancreas: No mass, inflammatory changes, or other significant
abnormality.

Spleen: Within normal limits in size and appearance.

Adrenals/Urinary Tract: Surgical changes again noted about the
superior-lateral margin of the left kidney, stable compared to
previous exams. No evidence of residual or recurrent disease.
Kidneys otherwise unremarkable without stone or hydronephrosis. No
ureteral or bladder calculi identified. Bladder appears normal.

Stomach/Bowel: Bowel is normal in caliber. No bowel wall thickening
or evidence of bowel wall inflammation. Appendix is normal. Small
hiatal hernia. Stomach appears otherwise normal.

Vascular/Lymphatic: Abdominal aorta is normal in caliber. No
enlarged lymph nodes seen within the abdomen or pelvis. Stable
apparent narrowing of the left common iliac vein. Stable appearance
of the prominent venous collaterals in the lower pelvis.

Reproductive: Cystic-appearing mass within the upper left adnexa is
unchanged, measuring 5.8 x 3.5 cm, the long-term stability
indicating benignity. Right adnexal region is unremarkable.

Other: No free fluid or abscess collection identified. No free
intraperitoneal air.

Musculoskeletal: Mild degenerative change throughout the slightly
scoliotic thoracolumbar spine. No acute- appearing osseous
abnormality. Superficial soft tissues are unremarkable.
IMPRESSION: 1. No acute findings. No free fluid or inflammatory change. No bowel
obstruction. No evidence of acute solid organ abnormality. No renal
or ureteral calculi. Appendix is normal.
2. Stable chronic and incidental findings detailed above. Prominent
venous collaterals within the lower pelvis appear stable compared to
multiple prior exams. This could conceivably represent a pelvic
venous congestion syndrome which would be a possible source for
chronic pelvic pain.

## 2018-01-17 ENCOUNTER — Ambulatory Visit: Payer: Self-pay | Admitting: Neurology

## 2018-02-05 ENCOUNTER — Encounter: Payer: Self-pay | Admitting: Neurology

## 2018-02-06 ENCOUNTER — Ambulatory Visit: Payer: BC Managed Care – PPO | Admitting: Neurology

## 2018-02-06 ENCOUNTER — Encounter: Payer: Self-pay | Admitting: Neurology

## 2018-02-06 VITALS — BP 105/75 | HR 81 | Ht 63.0 in | Wt 184.0 lb

## 2018-02-06 DIAGNOSIS — G4733 Obstructive sleep apnea (adult) (pediatric): Secondary | ICD-10-CM | POA: Diagnosis not present

## 2018-02-06 DIAGNOSIS — G4731 Primary central sleep apnea: Secondary | ICD-10-CM

## 2018-02-06 DIAGNOSIS — M542 Cervicalgia: Secondary | ICD-10-CM

## 2018-02-06 DIAGNOSIS — G4700 Insomnia, unspecified: Secondary | ICD-10-CM

## 2018-02-06 MED ORDER — IMIPRAMINE HCL 25 MG PO TABS: 25.0000 mg | ORAL_TABLET | Freq: Every day | ORAL | 11 refills | Status: DC

## 2018-02-06 NOTE — Progress Notes (Signed)
[facility]  PATIENT: [place] DOB: July 22, 1955  REFERRING CLINICIAN: Dr. Jefm Petty HISTORY FROM: Patient REASON FOR VISIT: Obstructive sleep apnea   Chief Complaint    Follow-up      HISTORICAL  CHIEF COMPLAINT:  Chief Complaint  Patient presents with  . Follow-up    OSA follow up room 13 patient alone    HISTORY OF PRESENT ILLNESS:  She is a 62 year old woman with severe obstructive sleep apnea with some central sleep apneas.   Update 02/06/2018: She tolerates BiPAP well.  She is on  VAuto with maximum IPAP 21 cm H2O, minimum EPAP 8 cm H2O and 5 cm H2O pressure support.  Compliance has been excellent with 30/30 days greater than 4 hours and averaging with a 9 hours a night.  AHI is okay at 13.7 though was better at 5.6 last time.  She had had very severe OSA BiPAP.    She actually puts her mask on while reading in bed.    Her Epworth is 0 today.    She uses a FF mask and occasionally has some leakage.    She has some insomnia but does well with zolpidem 10 mg and trazodone 50 mg nightly.      She has some headaches and neck pain.    This bothers her more in the afternoon and evenings.    An occipital nerve block had not helped.    She takes Tylenol I the evening if more bothersome   Update 07/17/2017: She tolerated BiPAP well and use sit nightly. .    She is on auto-BiPAP with a maximum pressure of 21 cm.  Compliance is excellent at 100%.  SHe uses BiPAP at an average of 8 hours and 54 minutes a day.  The AHI is good at 5.6/hr.    Before being placed on BiPAP, she had severe OSA.    She denies any problems with excessive daytime sleepiness.  She was having some issues with sleep onset insomnia.  Ambien 10 mg,  Trazodone 50 mg, and melatonin help her.  Once asleep she stays asleep.   Every now and then she wakes up and has trouble falling asleep.  She feels the headaches are not much of a problem.    She sometimes notes some memory issues but this  is stable and usually not a problem.      A few months ago, she had shaking in her hands.   This is better now.     From 11/27/2017: She is on Auto BiPAP with typical BiPAP 21/17.   Download shows that she has excellent compliance with 30/30 days of use for more than 4 hours. Her average night was just under 9 hours. The AHI was 12.5. At the last visit the AHI was 9.2. She has a 40 minute ramp but still sometimes has to reset the ramp due to the pressure being too high.   She had severe OSA before being placed on BiPAP.  She denies any excessive daytime sleepiness but this was never a problem.      She takes the BiPAP with her wherever she travels.    Epworth Sleepiness Scale score is 0.  She has trouble falling asleep but once asleep usually sleeps well.   She wakes up somewhat refreshed in the mornings. She is on Ambien 10 mg of melatonin nightly.  She still has difficulty falling asleep but once asleep well usually sleep well..    She has a  set routine at bedtime and takes Ambien 30-45 minutes before bedtime as that helps best.   She has never had any problems with side effects from Ambien.  Headaches are occurring almost every day, usually in the evening. A splenius capitis / occipital nerve block did not help more than a couple of days.     She has noted some difficulty with memory at times. Sometimes she has trouble coming up with the right words. This has not worsened over the last year or 2.     Her father had AD at age 40.       REVIEW OF SYSTEMS: Full 14 system review of systems performed and notable only for insomnia. She reports headaches  ALLERGIES: Allergies  Allergen Reactions  . Isovue [Iopamidol] Itching    Throat itching, tightness  . Simvastatin     cramps    HOME MEDICATIONS: Outpatient Medications Prior to Visit  Medication Sig Dispense Refill  . Ascorbic Acid (VITAMIN C) 100 MG tablet Take by mouth.    . B Complex Vitamins (VITAMIN-B COMPLEX) TABS Reported on  06/01/2015    . calcium carbonate (OS-CAL) 1250 (500 Ca) MG chewable tablet Chew by mouth.    . escitalopram (LEXAPRO) 20 MG tablet Take 20 mg by mouth.    . iron polysaccharides (NIFEREX) 150 MG capsule Take by mouth.    Marland Kitchen LORazepam (ATIVAN) 0.5 MG tablet TAKE 1 TABLET (0.5 MG TOTAL) BY MOUTH EVERY 8 (EIGHT) HOURS AS NEEDED FOR ANXIETY.  0  . omeprazole (PRILOSEC) 20 MG capsule Take by mouth.    . Pravastatin Sodium (PRAVACHOL PO) Take by mouth.    Marland Kitchen RIBOFLAVIN PO Reported on 06/01/2015    . VALSARTAN PO Take by mouth.     . valsartan-hydrochlorothiazide (DIOVAN-HCT) 160-12.5 MG per tablet     . zolpidem (AMBIEN) 10 MG tablet Take 1 tablet (10 mg total) by mouth at bedtime as needed. 90 tablet 1  . traZODone (DESYREL) 100 MG tablet 1/2 to 1 pill nightly po 90 tablet 3  . dicyclomine (BENTYL) 20 MG tablet Take 1 tablet (20 mg total) by mouth 2 (two) times daily. (Patient not taking: Reported on 02/06/2018) 20 tablet 0  . Escitalopram Oxalate (LEXAPRO PO) Take by mouth.    . iron polysaccharides (NIFEREX) 150 MG capsule Take by mouth.    . ranitidine (ZANTAC) 150 MG capsule     . traZODone (DESYREL) 100 MG tablet 1/2 to 1 pill nightly po     No facility-administered medications prior to visit.     PAST MEDICAL HISTORY: Past Medical History:  Diagnosis Date  . Anxiety   . Cancer ([place])   . GERD (gastroesophageal reflux disease)   . High cholesterol   . History of kidney cancer   . Hypertension   . Sleep apnea     PAST SURGICAL HISTORY: Past Surgical History:  Procedure Laterality Date  . HERNIA REPAIR    . kidney cancer surgery    . REPLACEMENT TOTAL KNEE      FAMILY HISTORY: She denies a history of OSA in family members.  SOCIAL HISTORY:  Social History   Socioeconomic History  . Marital status: Married    Spouse name: Not on file  . Number of children: 3  . Years of education: Masters  . Highest education level: Not on file  Occupational History  . Not on file    Social Needs  . Financial resource strain: Not on file  . Food insecurity:  Worry: Not on file    Inability: Not on file  . Transportation needs:    Medical: Not on file    Non-medical: Not on file  Tobacco Use  . Smoking status: Never Smoker  . Smokeless tobacco: Never Used  Substance and Sexual Activity  . Alcohol use: No    Alcohol/week: 0.0 standard drinks  . Drug use: No  . Sexual activity: Yes    Birth control/protection: Post-menopausal  Lifestyle  . Physical activity:    Days per week: Not on file    Minutes per session: Not on file  . Stress: Not on file  Relationships  . Social connections:    Talks on phone: Not on file    Gets together: Not on file    Attends religious service: Not on file    Active member of club or organization: Not on file    Attends meetings of clubs or organizations: Not on file    Relationship status: Not on file  . Intimate partner violence:    Fear of current or ex partner: Not on file    Emotionally abused: Not on file    Physically abused: Not on file    Forced sexual activity: Not on file  Other Topics Concern  . Not on file  Social History Narrative   Patient is married with one child.   Patient is left handed.   Patient has her Master's degree.   Patient drinks 2 cups daily.     PHYSICAL EXAM  Vitals:   02/06/18 1325  BP: 105/75  Pulse: 81  Weight: 184 lb (83.5 kg)  Height: 5\' 3"  (1.6 m)    Body mass index is 32.59 kg/m.  General: The patient is well-developed and well-nourished and in no acute distress.   Mild right occipital tenderness   Neurologic Exam  Mental status: The patient is alert and oriented x 3 at the time of the examination. The patient has apparent normal recent and remote memory, with an apparently normal attention span and concentration ability.   Speech is normal.  Cranial nerves: Extraocular movements are full.    Goof facial strngth and sensation.  Trapezius and sternocleidomastoid  strength is normal. No dysarthria is noted.  The tongue is midline, and the patient has symmetric elevation of the soft palate. No obvious hearing deficits are noted.  Motor:  Muscle bulk is normal.   Tone is normal. Strength is  5 / 5 in all 4 extremities.    Coordination: Good finger nose finger and heel to shin.    Gait and station: Station is normal.   Gait is normal. Tandem gait is normal.    Reflexes: Deep tendon reflexes are symmetric and normal bilaterally.      DIAGNOSTIC DATA (LABS, IMAGING, TESTING)    ASSESSMENT AND PLAN   Obstructive apnea  Complex sleep apnea syndrome  Neck pain  Insomnia, unspecified type    1.  Continue autoBiPAP up to 21/16.     She will try to see if AHI improves if she waits to put mask on till ready to sleep (sometimes up 1 hour or more with mask before going to sleep).  If still > 15, then check home oximetry overnight to see if hypoxic as most of the elevated AHI is central.   2.  Continue zolpidem at 10 mg nightly.   Change trazodone to imipramine as may help HA more 3.   Stay active, exercise and try to lose weight. 4.  rtc 6 months    Britt Bottom M.D. PhD 86/76/1950, 9:32 PM Certified in Neurology, Sleep Medicine , Neurophysiology and Neuroimaging  [facility] [address], [place], [place] [address] [phone]

## 2018-02-10 ENCOUNTER — Telehealth: Payer: Self-pay | Admitting: Neurology

## 2018-02-10 MED ORDER — ZOLPIDEM TARTRATE 10 MG PO TABS: 10.0000 mg | ORAL_TABLET | Freq: Every evening | ORAL | 1 refills | Status: DC | PRN

## 2018-02-10 NOTE — Telephone Encounter (Signed)
Patient requesting new Rx for zolpidem (AMBIEN) 10 MG tablet [facility] [name] in [place]. She has changed pharmacies.

## 2018-02-10 NOTE — Telephone Encounter (Signed)
[facility] at [phone]. Received fax confirmation.

## 2018-02-10 NOTE — Telephone Encounter (Signed)
rx printed, waiting on MD signature

## 2018-02-10 NOTE — Addendum Note (Signed)
Addended by: Rossie Muskrat L on: 02/10/2018 10:14 AM   Modules accepted: Orders

## 2018-02-14 ENCOUNTER — Other Ambulatory Visit: Payer: Self-pay | Admitting: *Deleted

## 2018-02-14 DIAGNOSIS — G4733 Obstructive sleep apnea (adult) (pediatric): Secondary | ICD-10-CM

## 2018-02-26 ENCOUNTER — Encounter: Payer: Self-pay | Admitting: Neurology

## 2018-03-02 ENCOUNTER — Other Ambulatory Visit: Payer: Self-pay | Admitting: Neurology

## 2018-03-04 ENCOUNTER — Telehealth: Payer: Self-pay | Admitting: *Deleted

## 2018-03-04 DIAGNOSIS — G4733 Obstructive sleep apnea (adult) (pediatric): Secondary | ICD-10-CM

## 2018-03-04 NOTE — Telephone Encounter (Signed)
Called pt. Went over results per Dr. Felecia Shelling note. Patient verbalized understanding. She uses [facility] for DME company. Advised we will send new order to them. She will call if she has any further questions or concerns.   Placed order in epic. Sent community message to [facility] [place] to let them know order placed.

## 2018-03-04 NOTE — Telephone Encounter (Signed)
-----   Message from Britt Bottom, MD sent at 03/03/2018  6:25 PM EST ----- Please let the patient know that the overnight oximetry looked normal.  She is currently on auto Bi-PAP with maximum IPAP = 21.     Lets change the setting (can go through her DME company) to maximum IPAP=21  Thank you

## 2018-04-22 ENCOUNTER — Encounter: Payer: Self-pay | Admitting: Neurology

## 2018-04-25 ENCOUNTER — Other Ambulatory Visit: Payer: Self-pay | Admitting: Neurology

## 2018-04-25 DIAGNOSIS — G4733 Obstructive sleep apnea (adult) (pediatric): Secondary | ICD-10-CM

## 2018-04-25 DIAGNOSIS — G4731 Primary central sleep apnea: Secondary | ICD-10-CM

## 2018-04-25 DIAGNOSIS — G4739 Other sleep apnea: Secondary | ICD-10-CM

## 2018-04-25 NOTE — Progress Notes (Signed)
Please let her know that the BiPAP download showed she has a lot of central apneas (where there is not effort to breathe) .   I would like her to come back in for a titration for a special mode of BiPAP that helps breathing effort during the central apneas.  I placed the order.  Thx

## 2018-04-25 NOTE — Progress Notes (Signed)
Sent py [employer].

## 2018-05-09 ENCOUNTER — Ambulatory Visit (INDEPENDENT_AMBULATORY_CARE_PROVIDER_SITE_OTHER): Payer: BC Managed Care – PPO | Admitting: Neurology

## 2018-05-09 DIAGNOSIS — G4731 Primary central sleep apnea: Secondary | ICD-10-CM

## 2018-05-09 DIAGNOSIS — G4733 Obstructive sleep apnea (adult) (pediatric): Secondary | ICD-10-CM

## 2018-05-13 NOTE — Progress Notes (Signed)
S PATIENT'S NAME:  [place], [place] DOB:      05/09/2019      MR#:    [id number]     DATE OF RECORDING: 05/09/2018 REFERRING M.D.:  Jefm Petty MD Study Performed:   CPAP  Titration HISTORY:  Pt in the lab for retitration to address any residual central sleep apnea problems.    The patient endorsed the Epworth Sleepiness Scale at 0 points and the Fatigue Score at --- points.    The patient's weight 183 pounds with a height of 63 (inches), resulting in a BMI of 32.4 kg/m2.  The patient's neck circumference measured 14 inches.  CURRENT MEDICATIONS: Lexapro, Ativan, Prilosec, Pravachol, Diovan, Ambien, Desyrel, Bentyl, Zantac    PROCEDURE:  This is a multichannel digital polysomnogram utilizing the SomnoStar 11.2 system.  Electrodes and sensors were applied and monitored per AASM Specifications.   EEG, EOG, Chin and Limb EMG, were sampled at 200 Hz.  ECG, Snore and Nasal Pressure, Thermal Airflow, Respiratory Effort, CPAP Flow and Pressure, Oximetry was sampled at 50 Hz. Digital video and audio were recorded.       BiPAP was initiated at 9/5 cmH20 with heated humidity per AASM split night standards and pressure was advanced to 10cmH20 because of hypopneas, apneas and desaturations.  At a PAP pressure of 11/6 cmH20, there was a reduction of the AHI to 1.7 with improvement of sleep apnea.  Lights Out was at 22:28 and Lights On at 05:00. Total recording time (TRT) was 392 minutes, with a total sleep time (TST) of 284.5 minutes. The patient's sleep latency was 52.5 minutes. REM latency was 265.5 minutes.  The sleep efficiency was 72.6 %.    SLEEP ARCHITECTURE: WASO (Wake after sleep onset)  was 54.5 minutes.  There were 22.5 minutes in Stage N1, 73 minutes Stage N2, 123 minutes Stage N3 and 66 minutes in Stage REM.  The percentage of Stage N1 was 7.9%, Stage N2 was 25.7%, Stage N3 was 43.2% and Stage R (REM sleep) was 23.2%.  The arousals were noted as: 19 were spontaneous, 0 were associated with  PLMs, 3 were associated with respiratory events.   RESPIRATORY ANALYSIS:  There was a total of 5 respiratory events: 0 obstructive apneas, 5 central apneas and 0 mixed apneas with a total of 5 apneas and an apnea index (AI) of 1.1 /hour. There were 0 hypopneas with a hypopnea index of 0/hour. The patient also had 1 respiratory event related arousals (RERAs).      The total APNEA/HYPOPNEA INDEX  (AHI) was 1.1 /hour and the total RESPIRATORY DISTURBANCE INDEX was 1.3 /hour  0 events occurred in REM sleep and 5 events in NREM. The REM AHI was 0 /hour versus a non-REM AHI of 1.4 /hour.  The patient spent 84 minutes of total sleep time in the supine position and 201 minutes in non-supine. The supine AHI was 2.9, versus a non-supine AHI of 0.3.  Some additional central pauses were noted in REM sleep not associate with reduced oxygenation.  OXYGEN SATURATION & C02:  The baseline 02 saturation was 94%, with the lowest being 78%. Time spent below 89% saturation equaled 3 minutes.  PERIODIC LIMB MOVEMENTS:  The patient had a total of 0 Periodic Limb Movements. The Periodic Limb Movement (PLM) index was 0 and the PLM Arousal index was 0 /hour.  Other: Audio and video analysis did not show any abnormal or unusual movements, behaviors, phonations or vocalizations.    The patient took 2 bathroom breaks.  EKG showed normal sinus rhythm (NSR).  Post-study, the patient indicated that sleep was the same as usual.   The patient was fitted with a Resmed Airtouch small mask.  DIAGNOSIS 1. OSA  2. Titration to BiPAP 11/7 cm H2O.  At this pressure, the AHI was 0.0   PLANS/RECOMMENDATIONS:  1. BiPAP +11/7 cm H2O.   2. Download in 30/90 days 3. Follow up with Dr. Felecia Shelling  A follow up appointment will be scheduled in the Sleep Clinic at [facility].   Please call [phone] with any questions.      I certify that I have reviewed the entire raw data recording prior to the issuance of this  report in accordance with the Standards of Accreditation of the [employer] [employer] (AASM)   Tyna Huertas,MD,PhD Diplomat, American Board of Psychiatry and Neurology, Sleep Medicine    Demographics and Medical History           Name: Karen [place] Age: 63 BMI: 32.4 Interp Physician: Arlice Colt, MD  DOB: 05/09/2019 Ht-IN: 63 CM: 160 Referred By: Jefm Petty MD  Pt. Tag:  Wt-LB: 183 KG: 83 Tested By: Bo Mcclintock, RPSGT  Pt. #: [id number] Sex: Female Scored By: Bo Mcclintock, RPSGT  Bed Tag: [id number] Race: ---   Sleep Summary    Sleep Time Statistics Minutes Hours    Time in Bed 392    6.5    Total Sleep Time 284.5    4.7    Total Sleep Time NREM 218.5    3.6    Total Sleep Time REM 66    1.1    Sleep Onset 52.5    0.9    Wake After Sleep Onset 54.5    0.9    Wake After Sleep Period 0.5    0.0    Latency Persistent Sleep 52.5    0.9    Sleep Efficiency 72.6 Percent    Lights out 22:28     Lights on 05:00    Sleep Disruption Events Count Index    Arousals 22 4.6    Awakenings 0 0    Arousals + Awakenings 22 4.6    REM Awakenings 1 0.2     Sleep Stage Statistics Wake N1 N2 N3 REM    Percent Stage to SPT 16.1  6.6  21.5  36.3  19.5  Percent   Sleep Period Time in Stage 54.5 22.5 73 123 66 Minutes   Latency to Stage  52.5 56.5 13.5 265.5 Minutes   Percent Stage to TST  7.9 25.7 43.2 23.2 Percent   EKG Summary          EKG Statistics         Heart Rate, Wake 66.5 BPM  TST Epochs in HR Interval 0 < 29   Heart Rate, Steady Sleep Avg 71 BPM   71 30-59   PAC Events 0 Count   343 60-79   PVC Events 0 Count   144 80-99   Bradycardia 0 Count   0 100-119   Tachycardia 0 Count   0 120-139        0 140-159    NREM REM   0 > 160   Shortest R-R .7 .6       Longest R-R 1.9 .8          Respiration Summary  Event Statistics Total  With Arousal  With Awakening    Count Index  Count Index  Count Index  Apneas, Total 5 1.1  2 0.4   0 0.0     Hypopneas, Total 0 0  0 0.0   0 0.0    Events (Apneas + Hypopneas) 5 1.1   2 0.4   0 0.0    Apneas, Supine 4 2.9     Apneas, Non Supine 1 .3     Hypopneas, Supine 0 0     Hypopneas, Non Supine 0 0     % Sleep Apnea .5 Percent     % Sleep Hypopnea 0 Percent    Oximetry Statistics       SpO2, Mean Wake 94 Percent     SpO2, Minimum 78 Percent     SpO2, Max 98 Percent     SpO2, Mean 93 Percent            Desaturation Index, REM 7.3  Index     Desaturation Index, NREM 2.7  Index     Desaturation Index, Total 3.8  Index            SpO2 Intervals > 89% 80-89% 70-79% 60-69% 50-59% 40-49% 30-39% < 30%  284.5 Percent Sleep Time 97.2 2.8 0 0 0 0 0 0  Body Position Statistics   Back Side L Side R Side Prone    Total Sleep Time   84 200.5 200.5 0 0 Minutes   Percent Time to TST   29.5  70.5  70.5  0.0  0.0  Percent   Number of Events   4 1.0 1 0 0 Count   Number of Apneas   4 1 1  0 0 Count   Number of Hypopneas   0 0 0 0 0 Count   Apnea Index   2.9  0.3  0.3  0.0  0.0  Index   Hypopnea Index   0.0  0.0  0.0  0.0  0.0  Index   Apnea + Hypopnea Index   2.9  0.3  0.3  0.0  0.0  Index    Non REM, Post Rx Statistics Non Supine  Supine    Central Mixed Obstr  Central Mixed Obstr   Apneas 1 0 0  4 0 0 Count  Apneas, Minimum SpO2 91 0 0  89 0 0 Percent     Hypopneas 0 0 0  0 0 0 Count  Hypopneas, Minimum SpO2 0 0 0  0 0 0 Percent     Apnea + Hypoapnea Index 0.3 0.0 0.0  13.3 0.0 0.0 Index    REM, Post Rx Statistics Non Supine  Supine    Central Mixed Obstr  Central Mixed Obstr   Apneas 0 0 0  0 0 0 Count  Apneas, Minimum SpO2 0 0 0  0 0 0 Percent     Hypopneas 0 0 0  0 0 0 Count  Hypopneas, Minimum SpO2 0 0 0  0 0 0 Percent     Apnea + Hypopnea Index 0.0 0.0 0.0  0.0 0.0 0.0 Index   Leg Movement Summary    PLM Non REM (Incl. Wake) REM Total    No Arousal Arousal Wake No Arousal Arousal Wake No Arousal Arousal Wake Total   Isolated 0 0 0 8 0 0 8 0 0 8     PLMS 0 0 0 0 0 0 0 0 0 0    Total 0 0 0 8 0 0 8 0 0 8   PLM Statistics PLMS Total     Count Index Count Index  PLM 0 0 8 1.7     PLM with Arousal 0 0 0 0.0     PLM, with Wake 0 0 0 0.0     PLM, Arousal + Wake 0 0.0 0 0.0     PLM, No Arousal 0 0.0  8 1.7     PLM, Non REM 0 0.0  0 0.0     PLM, REM 0 0.0  8 7.3     Technician Comments:  Pt scored per 4% hypopnea rule.  Pt only displayed central events following large arousals.  Cessation of breathing was also noted in REM sleep during eye movements.  ASV not used due to no ongoing central events noted.  Pt slept in supine and left positions.  No PLMs noted, some leg movements in REM.  Pt used own CPAP mask from home, Resmed Airtouch small.  NSR observed in EKG.  Pt observed in sleep stages N1, N2, N3, REM.  Nocturia x2.

## 2018-05-20 ENCOUNTER — Telehealth: Payer: Self-pay | Admitting: Neurology

## 2018-05-20 ENCOUNTER — Telehealth: Payer: Self-pay | Admitting: *Deleted

## 2018-05-20 MED ORDER — TRAZODONE HCL 100 MG PO TABS: ORAL_TABLET | ORAL | 11 refills | Status: DC

## 2018-05-20 MED ORDER — ARMODAFINIL 200 MG PO TABS: ORAL_TABLET | ORAL | 5 refills | Status: DC

## 2018-05-20 NOTE — Telephone Encounter (Signed)
I spoke to [place] about the BiPAP titration.  She was titrated to 11/7.  She did have a number of events during REM sleep where she had shallow breathing (central) but did not dip the oxygen.  She did not meet criteria to be advanced to ASV mode.  She will continue on the variable BiPAP.  She has fatigue more than sleepiness.  I will call in armodafinil.  Additionally trazodone worked better than imipramine and I will place her back on that for insomnia.

## 2018-05-20 NOTE — Telephone Encounter (Signed)
[place] armodafinil 200mg  tab on covermymeds. Key: [id number]. Waiting on determination.

## 2018-05-20 NOTE — Telephone Encounter (Signed)
Received fax notification from [employer] that [employer] approved 05/20/18-05/21/19. [employer] Non-grandfathered [id number].  [facility]ax[facility] at [phone]. Received fax confirmation.

## 2018-06-14 ENCOUNTER — Other Ambulatory Visit: Payer: Self-pay | Admitting: Neurology

## 2018-06-16 ENCOUNTER — Encounter: Payer: Self-pay | Admitting: *Deleted

## 2018-08-07 ENCOUNTER — Ambulatory Visit: Payer: Self-pay | Admitting: Neurology

## 2018-08-13 ENCOUNTER — Other Ambulatory Visit: Payer: Self-pay | Admitting: Neurology

## 2018-08-13 NOTE — Telephone Encounter (Signed)
Refused Ambien refill. Patient canceled FU in April 2020, stated she didn';t need to be seen.

## 2018-08-14 ENCOUNTER — Telehealth: Payer: Self-pay | Admitting: Neurology

## 2018-08-14 NOTE — Addendum Note (Signed)
Addended by: Rossie Muskrat L on: 08/14/2018 11:35 AM   Modules accepted: Orders

## 2018-08-14 NOTE — Telephone Encounter (Signed)
I reviewed pt chart. She was last seen 02/06/18. There is a note from appt that was cx on 08/07/18 that pt started she no longer needed to be seen.  I called pt. Advised Dr Felecia Shelling unable to refill unless he sees her. She agreed to scheduled doxy.me VV. I updated med list/pharmacy/allergy list.  Email: [email or web address]. Asked her to call back if she does not receive the email for doxy.me visit.

## 2018-08-14 NOTE — Telephone Encounter (Signed)
Pt has called for a refill on her zolpidem (AMBIEN) 10 MG tablet [id number]

## 2018-08-18 ENCOUNTER — Encounter: Payer: Self-pay | Admitting: Neurology

## 2018-08-18 ENCOUNTER — Other Ambulatory Visit: Payer: Self-pay

## 2018-08-18 ENCOUNTER — Ambulatory Visit (INDEPENDENT_AMBULATORY_CARE_PROVIDER_SITE_OTHER): Payer: BC Managed Care – PPO | Admitting: Neurology

## 2018-08-18 DIAGNOSIS — G4700 Insomnia, unspecified: Secondary | ICD-10-CM

## 2018-08-18 DIAGNOSIS — G4489 Other headache syndrome: Secondary | ICD-10-CM | POA: Diagnosis not present

## 2018-08-18 DIAGNOSIS — G4731 Primary central sleep apnea: Secondary | ICD-10-CM

## 2018-08-18 MED ORDER — ZOLPIDEM TARTRATE 10 MG PO TABS: 10.0000 mg | ORAL_TABLET | Freq: Every evening | ORAL | 1 refills | Status: DC | PRN

## 2018-08-18 NOTE — Progress Notes (Signed)
[facility]  PATIENT: Karen Cannon DOB: 19-Nov-1955  REFERRING CLINICIAN: Dr. Jefm Petty HISTORY FROM: Patient REASON FOR VISIT: Obstructive sleep apnea   Chief Complaint    Sleep Apnea      HISTORICAL  CHIEF COMPLAINT:  Chief Complaint  Patient presents with  . Sleep Apnea    on BiPAP    HISTORY OF PRESENT ILLNESS:  She is a 63 y.o. woman with severe obstructive sleep apnea with some central sleep apneas.   Update 08/18/2018: Virtual Visit via Video Note I connected with Karen Cannon on 08/18/18 at  1:00 PM EDT by a video enabled telemedicine application and verified that I am speaking with the correct person.  I discussed the limitations of evaluation and management by telemedicine and the availability of in person appointments. The patient expressed understanding and agreed to proceed.  History of Present Illness: Since the last visit, she had a BiPAP titration.  She did not meet criteria for ASV or ST modes.  A set BiPAP level was placed on her machine, +11/7.  She reports doing well on BiPAP.    AHI was 15-18 the last few times that she has checked.  Of note, the in lab titration showed that she had many shallow breaths without desaturations and these may have been over counted by her machine.  Her insomnia does well on Ambien 10 mg and 1/2 trazodone.   She notes mild daytime sleepiness.  She never took armodafinil  She notes a mild tremor in her hands.  She does not have a family history.  It does not prevent her from doing tasks with her hands.  Headaches are better.   They seem to come and go for periods of time and currently is doing better.   She notes some neck pain.      Observations/Objective: She is a well-developed well-nourished woman in no acute distress.  The head is normocephalic and atraumatic.  Sclera are anicteric.  Visible skin appears normal.  The neck has a good range of motion.  Pharynx and tongue have normal appearance.   She is alert and fully oriented with fluent speech and good attention, knowledge and memory.  Extraocular muscles are intact.  Facial strength is normal.  Palatal elevation and tongue protrusion are midline.  She appears to have normal strength in the arms.  Rapid alternating movements and finger-nose-finger are performed well.  Assessment and Plan: Complex sleep apnea syndrome  Insomnia, unspecified type  Other headache syndrome  1.   Continue BiPAP +11/7 cm H2O. 2.   Continue Ambien 10 mg and trazodone 50 mg at night. 3.   Return to see me in 6 months or sooner if there are new or worsening neurologic or sleep issues.  Follow Up Instructions: I discussed the assessment and treatment plan with the patient. The patient was provided an opportunity to ask questions and all were answered. The patient agreed with the plan and demonstrated an understanding of the instructions.    The patient was advised to call back or seek an in-person evaluation if the symptoms worsen or if the condition fails to improve as anticipated.  I provided 17 minutes of non-face-to-face time during this encounter.  ____________________ From previous visits:  Update 02/06/2018: She tolerates BiPAP well.  She is on  VAuto with maximum IPAP 21 cm H2O, minimum EPAP 8 cm H2O and 5 cm H2O pressure support.  Compliance has been excellent with 30/30 days greater than 4 hours and averaging  with a 9 hours a night.  AHI is okay at 13.7 though was better at 5.6 last time.  She had had very severe OSA BiPAP.    She actually puts her mask on while reading in bed.    Her Epworth is 0 today.    She uses a FF mask and occasionally has some leakage.    She has some insomnia but does well with zolpidem 10 mg and trazodone 50 mg nightly.      She has some headaches and neck pain.    This bothers her more in the afternoon and evenings.    An occipital nerve block had not helped.    She takes Tylenol I the evening if more bothersome    Update 07/17/2017: She tolerated BiPAP well and use sit nightly. .    She is on auto-BiPAP with a maximum pressure of 21 cm.  Compliance is excellent at 100%.  SHe uses BiPAP at an average of 8 hours and 54 minutes a day.  The AHI is good at 5.6/hr.    Before being placed on BiPAP, she had severe OSA.    She denies any problems with excessive daytime sleepiness.  She was having some issues with sleep onset insomnia.  Ambien 10 mg,  Trazodone 50 mg, and melatonin help her.  Once asleep she stays asleep.   Every now and then she wakes up and has trouble falling asleep.  She feels the headaches are not much of a problem.    She sometimes notes some memory issues but this is stable and usually not a problem.      A few months ago, she had shaking in her hands.   This is better now.     From 11/27/2017: She is on Auto BiPAP with typical BiPAP 21/17.   Download shows that she has excellent compliance with 30/30 days of use for more than 4 hours. Her average night was just under 9 hours. The AHI was 12.5. At the last visit the AHI was 9.2. She has a 40 minute ramp but still sometimes has to reset the ramp due to the pressure being too high.   She had severe OSA before being placed on BiPAP.  She denies any excessive daytime sleepiness but this was never a problem.      She takes the BiPAP with her wherever she travels.    Epworth Sleepiness Scale score is 0.  She has trouble falling asleep but once asleep usually sleeps well.   She wakes up somewhat refreshed in the mornings. She is on Ambien 10 mg of melatonin nightly.  She still has difficulty falling asleep but once asleep well usually sleep well..    She has a set routine at bedtime and takes Ambien 30-45 minutes before bedtime as that helps best.   She has never had any problems with side effects from Ambien.  Headaches are occurring almost every day, usually in the evening. A splenius capitis / occipital nerve block did not help more than a couple of  days.     She has noted some difficulty with memory at times. Sometimes she has trouble coming up with the right words. This has not worsened over the last year or 2.     Her father had AD at age 40.       REVIEW OF SYSTEMS: Full 14 system review of systems performed and notable only for insomnia. She reports headaches  ALLERGIES: Allergies  Allergen Reactions  .  Isovue [Iopamidol] Itching    Throat itching, tightness  . Simvastatin     cramps    HOME MEDICATIONS: Outpatient Medications Prior to Visit  Medication Sig Dispense Refill  . Ascorbic Acid (VITAMIN C) 100 MG tablet Take by mouth.    . B Complex Vitamins (VITAMIN-B COMPLEX) TABS Reported on 06/01/2015    . calcium carbonate (OS-CAL) 1250 (500 Ca) MG chewable tablet Chew by mouth.    . escitalopram (LEXAPRO) 20 MG tablet Take 10 mg by mouth daily.     Marland Kitchen LORazepam (ATIVAN) 0.5 MG tablet TAKE 1 TABLET (0.5 MG TOTAL) BY MOUTH EVERY 8 (EIGHT) HOURS AS NEEDED FOR ANXIETY.  0  . omeprazole (PRILOSEC) 20 MG capsule Take 40 mg by mouth 2 (two) times daily before a meal.     . Pravastatin Sodium (PRAVACHOL PO) Take 20 mg by mouth daily.     . traZODone (DESYREL) 100 MG tablet TAKE 1/2 TO 1 TABLET BY MOUTH AT BEDTIME 90 tablet 4  . zolpidem (AMBIEN) 10 MG tablet Take 1 tablet (10 mg total) by mouth at bedtime as needed. 90 tablet 1   No facility-administered medications prior to visit.     PAST MEDICAL HISTORY: Past Medical History:  Diagnosis Date  . Anxiety   . Cancer ([place])   . GERD (gastroesophageal reflux disease)   . High cholesterol   . History of kidney cancer   . Hypertension   . Sleep apnea     PAST SURGICAL HISTORY: Past Surgical History:  Procedure Laterality Date  . HERNIA REPAIR    . kidney cancer surgery    . REPLACEMENT TOTAL KNEE      FAMILY HISTORY: She denies a history of OSA in family members.  SOCIAL HISTORY:  Social History   Socioeconomic History  . Marital status: Married    Spouse  name: Not on file  . Number of children: 3  . Years of education: Masters  . Highest education level: Not on file  Occupational History  . Not on file  Social Needs  . Financial resource strain: Not on file  . Food insecurity:    Worry: Not on file    Inability: Not on file  . Transportation needs:    Medical: Not on file    Non-medical: Not on file  Tobacco Use  . Smoking status: Never Smoker  . Smokeless tobacco: Never Used  Substance and Sexual Activity  . Alcohol use: No    Alcohol/week: 0.0 standard drinks  . Drug use: No  . Sexual activity: Yes    Birth control/protection: Post-menopausal  Lifestyle  . Physical activity:    Days per week: Not on file    Minutes per session: Not on file  . Stress: Not on file  Relationships  . Social connections:    Talks on phone: Not on file    Gets together: Not on file    Attends religious service: Not on file    Active member of club or organization: Not on file    Attends meetings of clubs or organizations: Not on file    Relationship status: Not on file  . Intimate partner violence:    Fear of current or ex partner: Not on file    Emotionally abused: Not on file    Physically abused: Not on file    Forced sexual activity: Not on file  Other Topics Concern  . Not on file  Social History Narrative   Patient is married  with one child.   Patient is left handed.   Patient has her Master's degree.   Patient drinks 2 cups daily.     PHYSICAL EXAM  There were no vitals filed for this visit.  There is no height or weight on file to calculate BMI.  General: The patient is well-developed and well-nourished and in no acute distress.   Mild right occipital tenderness   Neurologic Exam  Mental status: The patient is alert and oriented x 3 at the time of the examination. The patient has apparent normal recent and remote memory, with an apparently normal attention span and concentration ability.   Speech is normal.  Cranial  nerves: Extraocular movements are full.    Goof facial strngth and sensation.  Trapezius and sternocleidomastoid strength is normal. No dysarthria is noted.  The tongue is midline, and the patient has symmetric elevation of the soft palate. No obvious hearing deficits are noted.  Motor:  Muscle bulk is normal.   Tone is normal. Strength is  5 / 5 in all 4 extremities.    Coordination: Good finger nose finger and heel to shin.    Gait and station: Station is normal.   Gait is normal. Tandem gait is normal.    Reflexes: Deep tendon reflexes are symmetric and normal bilaterally.      DIAGNOSTIC DATA (LABS, IMAGING, TESTING)    ASSESSMENT AND PLAN   Complex sleep apnea syndrome  Insomnia, unspecified type  Other headache syndrome       Britt Bottom M.D. PhD 09/13/3708, 6:26 PM Certified in Neurology, Sleep Medicine , Neurophysiology and Neuroimaging  [facility] [address] [place], [place] [address] [phone]

## 2018-08-18 NOTE — Telephone Encounter (Signed)
Patient did not receive email, ,please resend.

## 2018-08-18 NOTE — Telephone Encounter (Signed)
I called pt. She accidentally deleted email. I re-sent and she confirmed she received second email while I was on the phone with her. Nothing further needed.

## 2019-02-11 ENCOUNTER — Other Ambulatory Visit: Payer: Self-pay

## 2019-02-11 ENCOUNTER — Encounter: Payer: Self-pay | Admitting: Family Medicine

## 2019-02-11 ENCOUNTER — Ambulatory Visit: Payer: BC Managed Care – PPO | Admitting: Family Medicine

## 2019-02-11 VITALS — BP 120/79 | HR 73 | Temp 97.9°F | Ht 63.0 in | Wt 175.0 lb

## 2019-02-11 DIAGNOSIS — G4700 Insomnia, unspecified: Secondary | ICD-10-CM | POA: Diagnosis not present

## 2019-02-11 DIAGNOSIS — G4731 Primary central sleep apnea: Secondary | ICD-10-CM | POA: Diagnosis not present

## 2019-02-11 DIAGNOSIS — G4730 Sleep apnea, unspecified: Secondary | ICD-10-CM | POA: Diagnosis not present

## 2019-02-11 MED ORDER — ZOLPIDEM TARTRATE 10 MG PO TABS: 10.0000 mg | ORAL_TABLET | Freq: Every evening | ORAL | 1 refills | Status: DC | PRN

## 2019-02-11 NOTE — Patient Instructions (Signed)
Continue BiPAP nightly and for greater than 4 hours each night  Follow up in 6 months   Zolpidem tablets What is this medicine? ZOLPIDEM (zole PI dem) is used to treat insomnia. This medicine helps you to fall asleep and sleep through the night. This medicine may be used for other purposes; ask your health care provider or pharmacist if you have questions. COMMON BRAND NAME(S): Ambien What should I tell my health care provider before I take this medicine? They need to know if you have any of these conditions:  depression  history of drug abuse or addiction  if you often drink alcohol  liver disease  lung or breathing disease  myasthenia gravis  sleep apnea  sleep-walking, driving, eating or other activity while not fully awake after taking a sleep medicine  suicidal thoughts, plans, or attempt; a previous suicide attempt by you or a family member  an unusual or allergic reaction to zolpidem, other medicines, foods, dyes, or preservatives  pregnant or trying to get pregnant  breast-feeding How should I use this medicine? Take this medicine by mouth with a glass of water. Follow the directions on the prescription label. It is better to take this medicine on an empty stomach and only when you are ready for bed. Do not take your medicine more often than directed. If you have been taking this medicine for several weeks and suddenly stop taking it, you may get unpleasant withdrawal symptoms. Your doctor or health care professional may want to gradually reduce the dose. Do not stop taking this medicine on your own. Always follow your doctor or health care professional's advice. A special MedGuide will be given to you by the pharmacist with each prescription and refill. Be sure to read this information carefully each time. Talk to your pediatrician regarding the use of this medicine in children. Special care may be needed. Overdosage: If you think you have taken too much of this  medicine contact a poison control center or emergency room at once. NOTE: This medicine is only for you. Do not share this medicine with others. What if I miss a dose? This does not apply. This medicine should only be taken immediately before going to sleep. Do not take double or extra doses. What may interact with this medicine?  alcohol  antihistamines for allergy, cough and cold  certain medicines for anxiety or sleep  certain medicines for depression, like amitriptyline, fluoxetine, sertraline  certain medicines for fungal infections like ketoconazole and itraconazole  certain medicines for seizures like phenobarbital, primidone  ciprofloxacin  dietary supplements for sleep, like valerian or kava kava  general anesthetics like halothane, isoflurane, methoxyflurane, propofol  local anesthetics like lidocaine, pramoxine, tetracaine  medicines that relax muscles for surgery  narcotic medicines for pain  phenothiazines like chlorpromazine, mesoridazine, prochlorperazine, thioridazine  rifampin This list may not describe all possible interactions. Give your health care provider a list of all the medicines, herbs, non-prescription drugs, or dietary supplements you use. Also tell them if you smoke, drink alcohol, or use illegal drugs. Some items may interact with your medicine. What should I watch for while using this medicine? Visit your doctor or health care professional for regular checks on your progress. Keep a regular sleep schedule by going to bed at about the same time each night. Avoid caffeine-containing drinks in the evening hours. When sleep medicines are used every night for more than a few weeks, they may stop working. Talk to your doctor if you still have  trouble sleeping. After taking this medicine, you may get up out of bed and do an activity that you do not know you are doing. The next morning, you may have no memory of this. Activities include driving a car  ("sleep-driving"), making and eating food, talking on the phone, sexual activity, and sleep-walking. Serious injuries have occurred. Stop the medicine and call your doctor right away if you find out you have done any of these activities. Do not take this medicine if you have used alcohol that evening. Do not take it if you have taken another medicine for sleep. The risk of doing these sleep-related activities is higher. Wait for at least 8 hours after you take a dose before driving or doing other activities that require full mental alertness. Do not take this medicine unless you are able to stay in bed for a full night (7 to 8 hours) before you must be active again. You may have a decrease in mental alertness the day after use, even if you feel that you are fully awake. Tell your doctor if you will need to perform activities requiring full alertness, such as driving, the next day. Do not stand or sit up quickly after taking this medicine, especially if you are an older patient. This reduces the risk of dizzy or fainting spells. If you or your family notice any changes in your behavior, such as new or worsening depression, thoughts of harming yourself, anxiety, other unusual or disturbing thoughts, or memory loss, call your doctor right away. After you stop taking this medicine, you may have trouble falling asleep. This is called rebound insomnia. This problem usually goes away on its own after 1 or 2 nights. What side effects may I notice from receiving this medicine? Side effects that you should report to your doctor or health care professional as soon as possible:  allergic reactions like skin rash, itching or hives, swelling of the face, lips, or tongue  breathing problems  changes in vision  confusion  depressed mood or other changes in moods or emotions  feeling faint or lightheaded, falls  hallucinations  loss of balance or coordination  loss of memory  numbness or tingling of the  tongue  restlessness, excitability, or feelings of anxiety or agitation  signs and symptoms of liver injury like dark yellow or brown urine; general ill feeling or flu-like symptoms; light-colored stools; loss of appetite; nausea; right upper belly pain; unusually weak or tired; yellowing of the eyes or skin  suicidal thoughts  unusual activities while not fully awake like driving, eating, making phone calls, or sexual activity Side effects that usually do not require medical attention (report to your doctor or health care professional if they continue or are bothersome):  dizziness  drowsiness the day after you take this medicine  headache This list may not describe all possible side effects. Call your doctor for medical advice about side effects. You may report side effects to FDA at 1-800-FDA-1088. Where should I keep my medicine? Keep out of the reach of children. This medicine can be abused. Keep your medicine in a safe place to protect it from theft. Do not share this medicine with anyone. Selling or giving away this medicine is dangerous and against the law. This medicine may cause accidental overdose and death if taken by other adults, children, or pets. Mix any unused medicine with a substance like cat litter or coffee grounds. Then throw the medicine away in a sealed container like a sealed  bag or a coffee can with a lid. Do not use the medicine after the expiration date. Store at room temperature between 20 and 25 degrees C (68 and 77 degrees F). NOTE: This sheet is a summary. It may not cover all possible information. If you have questions about this medicine, talk to your doctor, pharmacist, or health care provider.  2020 Elsevier/Gold Standard (2017-12-13 11:51:08)    Sleep Apnea Sleep apnea affects breathing during sleep. It causes breathing to stop for a short time or to become shallow. It can also increase the risk of:  Heart attack.  Stroke.  Being very overweight  (obese).  Diabetes.  Heart failure.  Irregular heartbeat. The goal of treatment is to help you breathe normally again. What are the causes? There are three kinds of sleep apnea:  Obstructive sleep apnea. This is caused by a blocked or collapsed airway.  Central sleep apnea. This happens when the brain does not send the right signals to the muscles that control breathing.  Mixed sleep apnea. This is a combination of obstructive and central sleep apnea. The most common cause of this condition is a collapsed or blocked airway. This can happen if:  Your throat muscles are too relaxed.  Your tongue and tonsils are too large.  You are overweight.  Your airway is too small. What increases the risk?  Being overweight.  Smoking.  Having a small airway.  Being older.  Being female.  Drinking alcohol.  Taking medicines to calm yourself (sedatives or tranquilizers).  Having family members with the condition. What are the signs or symptoms?  Trouble staying asleep.  Being sleepy or tired during the day.  Getting angry a lot.  Loud snoring.  Headaches in the morning.  Not being able to focus your mind (concentrate).  Forgetting things.  Less interest in sex.  Mood swings.  Personality changes.  Feelings of sadness (depression).  Waking up a lot during the night to pee (urinate).  Dry mouth.  Sore throat. How is this diagnosed?  Your medical history.  A physical exam.  A test that is done when you are sleeping (sleep study). The test is most often done in a sleep lab but may also be done at home. How is this treated?   Sleeping on your side.  Using a medicine to get rid of mucus in your nose (decongestant).  Avoiding the use of alcohol, medicines to help you relax, or certain pain medicines (narcotics).  Losing weight, if needed.  Changing your diet.  Not smoking.  Using a machine to open your airway while you sleep, such as: ? An oral  appliance. This is a mouthpiece that shifts your lower jaw forward. ? A CPAP device. This device blows air through a mask when you breathe out (exhale). ? An EPAP device. This has valves that you put in each nostril. ? A BPAP device. This device blows air through a mask when you breathe in (inhale) and breathe out.  Having surgery if other treatments do not work. It is important to get treatment for sleep apnea. Without treatment, it can lead to:  High blood pressure.  Coronary artery disease.  In men, not being able to have an erection (impotence).  Reduced thinking ability. Follow these instructions at home: Lifestyle  Make changes that your doctor recommends.  Eat a healthy diet.  Lose weight if needed.  Avoid alcohol, medicines to help you relax, and some pain medicines.  Do not use any products  that contain nicotine or tobacco, such as cigarettes, e-cigarettes, and chewing tobacco. If you need help quitting, ask your doctor. General instructions  Take over-the-counter and prescription medicines only as told by your doctor.  If you were given a machine to use while you sleep, use it only as told by your doctor.  If you are having surgery, make sure to tell your doctor you have sleep apnea. You may need to bring your device with you.  Keep all follow-up visits as told by your doctor. This is important. Contact a doctor if:  The machine that you were given to use during sleep bothers you or does not seem to be working.  You do not get better.  You get worse. Get help right away if:  Your chest hurts.  You have trouble breathing in enough air.  You have an uncomfortable feeling in your back, arms, or stomach.  You have trouble talking.  One side of your body feels weak.  A part of your face is hanging down. These symptoms may be an emergency. Do not wait to see if the symptoms will go away. Get medical help right away. Call your local emergency services (911  in the [place] Do not drive yourself to the hospital. Summary  This condition affects breathing during sleep.  The most common cause is a collapsed or blocked airway.  The goal of treatment is to help you breathe normally while you sleep. This information is not intended to replace advice given to you by your health care provider. Make sure you discuss any questions you have with your health care provider. Document Released: 01/03/2008 Document Revised: 01/10/2018 Document Reviewed: 11/19/2017 Elsevier Patient Education  2020 [employer].

## 2019-02-11 NOTE — Progress Notes (Signed)
I have read the note, and I agree with the clinical assessment and plan.  Richard A. Sater, MD, PhD, FAAN Certified in Neurology, Clinical Neurophysiology, Sleep Medicine, Pain Medicine and Neuroimaging  [facility] [address], [address] [place], [place] [address] [phone]  

## 2019-02-11 NOTE — Progress Notes (Signed)
PATIENT: Karen Cannon DOB: 15-Jun-1955  REASON FOR VISIT: follow up HISTORY FROM: patient  Chief Complaint  Patient presents with  . Follow-up    5 mon f/u. Alone. Rm 2. Patient mentioned that she has been having issues with her sciatic nerve off and on.      HISTORY OF PRESENT ILLNESS: Today 02/11/19 Karen Cannon is a 63 y.o. 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 She reports doing well. She is sleeping better. She continues Ambien 10mg  and trazodone 50-100mg  at night (usually 50mg ). She denies any adverse effects with medications. She is using BiPAP nightly. She has monitored events each night and reports about 7-8 events/hr on average.  Compliance report dated 01/10/2019 through 02/08/2019 reveals that she has used BiPAP therapy 30 out of the last 30 days for compliance of 100%.  All 30 days she used BiPAP greater than 4 hours.  Average usage was 8 hours and 34 minutes.  AHI was 7.7 with max IPAP of 21 cm of water and minimum EPAP of 8 cmH2O.  Pressure support of 5 cm of water.  There was no significant leak noted.  Approximately 3.8 central and 3.6 obstructive apneas.  HISTORY: (copied from Dr Garth Bigness note on 08/18/2018)  Since the last visit, she had a BiPAP titration.  She did not meet criteria for ASV or ST modes.  A set BiPAP level was placed on her machine, +11/7.  She reports doing well on BiPAP.    AHI was 15-18 the last few times that she has checked.  Of note, the in lab titration showed that she had many shallow breaths without desaturations and these may have been over counted by her machine.  Her insomnia does well on Ambien 10 mg and 1/2 trazodone.   She notes mild daytime sleepiness.  She never took armodafinil  She notes a mild tremor in her hands.  She does not have a family history.  It does not prevent her from doing tasks with her hands.  Headaches are better.   They seem to come and go for periods of time and currently is  doing better.   She notes some neck pain.    REVIEW OF SYSTEMS: Out of a complete 14 system review of symptoms, the patient complains only of the following symptoms, headaches and all other reviewed systems are negative.  Epworth sleepiness scale: 0 Fatigue severity scale: 15  ALLERGIES: Allergies  Allergen Reactions  . Isovue [Iopamidol] Itching    Throat itching, tightness  . Iodine   . Simvastatin     cramps    HOME MEDICATIONS: Outpatient Medications Prior to Visit  Medication Sig Dispense Refill  . Ascorbic Acid (VITAMIN C) 100 MG tablet Take by mouth.    . B Complex Vitamins (VITAMIN-B COMPLEX) TABS Reported on 06/01/2015    . calcium carbonate (OS-CAL) 1250 (500 Ca) MG chewable tablet Chew by mouth.    . escitalopram (LEXAPRO) 20 MG tablet Take 10 mg by mouth daily.     Marland Kitchen LORazepam (ATIVAN) 0.5 MG tablet TAKE 1 TABLET (0.5 MG TOTAL) BY MOUTH EVERY 8 (EIGHT) HOURS AS NEEDED FOR ANXIETY.  0  . ondansetron (ZOFRAN) 4 MG tablet Take 4 mg by mouth every 6 (six) hours as needed.    . pantoprazole (PROTONIX) 40 MG tablet Take 40 mg by mouth daily.    . Pravastatin Sodium (PRAVACHOL PO) Take 20 mg by mouth daily.     Marland Kitchen  traZODone (DESYREL) 100 MG tablet TAKE 1/2 TO 1 TABLET BY MOUTH AT BEDTIME 90 tablet 4  . zolpidem (AMBIEN) 10 MG tablet Take 1 tablet (10 mg total) by mouth at bedtime as needed. 90 tablet 1  . omeprazole (PRILOSEC) 20 MG capsule Take 40 mg by mouth 2 (two) times daily before a meal.      No facility-administered medications prior to visit.     PAST MEDICAL HISTORY: Past Medical History:  Diagnosis Date  . Anxiety   . Cancer ([place])   . GERD (gastroesophageal reflux disease)   . High cholesterol   . History of kidney cancer   . Hypertension   . Sleep apnea     PAST SURGICAL HISTORY: Past Surgical History:  Procedure Laterality Date  . HERNIA REPAIR    . kidney cancer surgery    . REPLACEMENT TOTAL KNEE      FAMILY HISTORY: History reviewed. No  pertinent family history.  SOCIAL HISTORY: Social History   Socioeconomic History  . Marital status: Married    Spouse name: Not on file  . Number of children: 3  . Years of education: Masters  . Highest education level: Not on file  Occupational History  . Not on file  Social Needs  . Financial resource strain: Not on file  . Food insecurity    Worry: Not on file    Inability: Not on file  . Transportation needs    Medical: Not on file    Non-medical: Not on file  Tobacco Use  . Smoking status: Never Smoker  . Smokeless tobacco: Never Used  Substance and Sexual Activity  . Alcohol use: No    Alcohol/week: 0.0 standard drinks  . Drug use: No  . Sexual activity: Yes    Birth control/protection: Post-menopausal  Lifestyle  . Physical activity    Days per week: Not on file    Minutes per session: Not on file  . Stress: Not on file  Relationships  . Social Herbalist on phone: Not on file    Gets together: Not on file    Attends religious service: Not on file    Active member of club or organization: Not on file    Attends meetings of clubs or organizations: Not on file    Relationship status: Not on file  . Intimate partner violence    Fear of current or ex partner: Not on file    Emotionally abused: Not on file    Physically abused: Not on file    Forced sexual activity: Not on file  Other Topics Concern  . Not on file  Social History Narrative   Patient is married with one child.   Patient is left handed.   Patient has her Master's degree.   Patient drinks 2 cups daily.      PHYSICAL EXAM  Vitals:   02/11/19 1122  BP: 120/79  Pulse: 73  Temp: 97.9 F (36.6 C)  TempSrc: Oral  Weight: 175 lb (79.4 kg)  Height: 5\' 3"  (1.6 m)   Body mass index is 31 kg/m.  Generalized: Well developed, in no acute distress  Cardiology: normal rate and rhythm, no murmur noted Respiratory: Clear to auscultation bilaterally Neurological examination   Mentation: Alert oriented to time, place, history taking. Follows all commands speech and language fluent Cranial nerve II-XII: Pupils were equal round reactive to light. Extraocular movements were full, visual field were full on confrontational test.  Motor: The motor  testing reveals 5 over 5 strength of all 4 extremities. Good symmetric motor tone is noted throughout.   Gait and station: Gait is normal.   DIAGNOSTIC DATA (LABS, IMAGING, TESTING) - I reviewed patient records, labs, notes, testing and imaging myself where available.  No flowsheet data found.   Lab Results  Component Value Date   WBC 5.8 08/27/2015   HGB 12.8 08/27/2015   HCT 38.3 08/27/2015   MCV 97.2 08/27/2015   PLT 283 08/27/2015      Component Value Date/Time   NA 141 08/27/2015 1440   K 3.4 (L) 08/27/2015 1440   CL 106 08/27/2015 1440   CO2 27 08/27/2015 1440   GLUCOSE 92 08/27/2015 1440   BUN 9 08/27/2015 1440   CREATININE 0.49 08/27/2015 1440   CALCIUM 9.7 08/27/2015 1440   PROT 7.1 08/27/2015 1440   ALBUMIN 4.1 08/27/2015 1440   AST 17 08/27/2015 1440   ALT 17 08/27/2015 1440   ALKPHOS 65 08/27/2015 1440   BILITOT 0.5 08/27/2015 1440   GFRNONAA >60 08/27/2015 1440   GFRAA >60 08/27/2015 1440   No results found for: CHOL, HDL, LDLCALC, LDLDIRECT, TRIG, CHOLHDL No results found for: HGBA1C No results found for: VITAMINB12 No results found for: TSH     ASSESSMENT AND PLAN 63 y.o. year old female  has a past medical history of Anxiety, Cancer ([facility]), GERD (gastroesophageal reflux disease), High cholesterol, History of kidney cancer, Hypertension, and Sleep apnea. here with     ICD-10-CM   1. Sleep apnea treated with nocturnal BiPAP  G47.30   2. Complex sleep apnea syndrome  G47.31   3. Insomnia, unspecified type  G47.00     Melody is doing well.  She continues BiPAP therapy nightly at home.  Compliance report reveals excellent compliance.  AHI has significantly reduced and now 7.7/h on  average.  She reports that she is feeling well.  We will continue current settings.  She was encouraged to use BiPAP nightly and for greater than 4 hours each night.  She continues to tolerate Ambien and trazodone for insomnia.  We have potential side effects of Ambien.  She verbalizes understanding.  We will continue Ambien 10 mg and trazodone 50 to 100 mg nightly.  She will follow-up with [place] in 6 months, sooner if needed.  She verbalizes understanding and agreement with this plan.   No orders of the defined types were placed in this encounter.    Meds ordered this encounter  Medications  . zolpidem (AMBIEN) 10 MG tablet    Sig: Take 1 tablet (10 mg total) by mouth at bedtime as needed.    Dispense:  90 tablet    Refill:  1    Order Specific Question:   Supervising Provider    Answer:   Bess Harvest, FNP-C 02/11/2019, 12:02 PM [facility] [address] [place], [place] [address] [phone]

## 2019-05-05 ENCOUNTER — Telehealth: Payer: Self-pay | Admitting: *Deleted

## 2019-05-05 NOTE — Telephone Encounter (Signed)
Faxed signed orders for CPAP supplies back to [facility] at [phone]. Received fax confirmation.

## 2019-08-19 ENCOUNTER — Encounter: Payer: Self-pay | Admitting: Family Medicine

## 2019-08-19 ENCOUNTER — Ambulatory Visit: Payer: Medicare HMO | Admitting: Family Medicine

## 2019-08-19 ENCOUNTER — Other Ambulatory Visit: Payer: Self-pay

## 2019-08-19 VITALS — BP 125/79 | HR 66 | Temp 97.1°F | Ht 63.0 in | Wt 168.0 lb

## 2019-08-19 DIAGNOSIS — G4700 Insomnia, unspecified: Secondary | ICD-10-CM | POA: Diagnosis not present

## 2019-08-19 DIAGNOSIS — G4731 Primary central sleep apnea: Secondary | ICD-10-CM | POA: Diagnosis not present

## 2019-08-19 DIAGNOSIS — G4730 Sleep apnea, unspecified: Secondary | ICD-10-CM

## 2019-08-19 DIAGNOSIS — G4489 Other headache syndrome: Secondary | ICD-10-CM | POA: Diagnosis not present

## 2019-08-19 MED ORDER — TRAZODONE HCL 50 MG PO TABS: ORAL_TABLET | ORAL | 3 refills | Status: DC

## 2019-08-19 MED ORDER — ZOLPIDEM TARTRATE 10 MG PO TABS: 10.0000 mg | ORAL_TABLET | Freq: Every evening | ORAL | 1 refills | Status: DC | PRN

## 2019-08-19 NOTE — Progress Notes (Signed)
I have read the note, and I agree with the clinical assessment and plan.  Abbegale Stehle A. Abdulrahim Siddiqi, MD, PhD, FAAN Certified in Neurology, Clinical Neurophysiology, Sleep Medicine, Pain Medicine and Neuroimaging  [facility] [address], [address] [place], [place] [address] [phone]  

## 2019-08-19 NOTE — Patient Instructions (Signed)
We will continue BiPAP therapy. Continue nightly use. We will continue Ambien at 10mg  daily. Try to continue 5mg  dosing each night, we can decrease dose if you do well. We will continue trazodone 50mg  at night.   Stay well hydrated. Well balanced diet and regular exercise encouraged.   Follow up in 6 months   Insomnia Insomnia is a sleep disorder that makes it difficult to fall asleep or stay asleep. Insomnia can cause fatigue, low energy, difficulty concentrating, mood swings, and poor performance at work or school. There are three different ways to classify insomnia:  Difficulty falling asleep.  Difficulty staying asleep.  Waking up too early in the morning. Any type of insomnia can be long-term (chronic) or short-term (acute). Both are common. Short-term insomnia usually lasts for three months or less. Chronic insomnia occurs at least three times a week for longer than three months. What are the causes? Insomnia may be caused by another condition, situation, or substance, such as:  Anxiety.  Certain medicines.  Gastroesophageal reflux disease (GERD) or other gastrointestinal conditions.  Asthma or other breathing conditions.  Restless legs syndrome, sleep apnea, or other sleep disorders.  Chronic pain.  Menopause.  Stroke.  Abuse of alcohol, tobacco, or illegal drugs.  Mental health conditions, such as depression.  Caffeine.  Neurological disorders, such as Alzheimer's disease.  An overactive thyroid (hyperthyroidism). Sometimes, the cause of insomnia may not be known. What increases the risk? Risk factors for insomnia include:  Gender. Women are affected more often than men.  Age. Insomnia is more common as you get older.  Stress.  Lack of exercise.  Irregular work schedule or working night shifts.  Traveling between different time zones.  Certain medical and mental health conditions. What are the signs or symptoms? If you have insomnia, the main  symptom is having trouble falling asleep or having trouble staying asleep. This may lead to other symptoms, such as:  Feeling fatigued or having low energy.  Feeling nervous about going to sleep.  Not feeling rested in the morning.  Having trouble concentrating.  Feeling irritable, anxious, or depressed. How is this diagnosed? This condition may be diagnosed based on:  Your symptoms and medical history. Your health care provider may ask about: ? Your sleep habits. ? Any medical conditions you have. ? Your mental health.  A physical exam. How is this treated? Treatment for insomnia depends on the cause. Treatment may focus on treating an underlying condition that is causing insomnia. Treatment may also include:  Medicines to help you sleep.  Counseling or therapy.  Lifestyle adjustments to help you sleep better. Follow these instructions at home: Eating and drinking   Limit or avoid alcohol, caffeinated beverages, and cigarettes, especially close to bedtime. These can disrupt your sleep.  Do not eat a large meal or eat spicy foods right before bedtime. This can lead to digestive discomfort that can make it hard for you to sleep. Sleep habits   Keep a sleep diary to help you and your health care provider figure out what could be causing your insomnia. Write down: ? When you sleep. ? When you wake up during the night. ? How well you sleep. ? How rested you feel the next day. ? Any side effects of medicines you are taking. ? What you eat and drink.  Make your bedroom a dark, comfortable place where it is easy to fall asleep. ? Put up shades or blackout curtains to block light from outside. ? Use a  white noise machine to block noise. ? Keep the temperature cool.  Limit screen use before bedtime. This includes: ? Watching TV. ? Using your smartphone, tablet, or computer.  Stick to a routine that includes going to bed and waking up at the same times every day and  night. This can help you fall asleep faster. Consider making a quiet activity, such as reading, part of your nighttime routine.  Try to avoid taking naps during the day so that you sleep better at night.  Get out of bed if you are still awake after 15 minutes of trying to sleep. Keep the lights down, but try reading or doing a quiet activity. When you feel sleepy, go back to bed. General instructions  Take over-the-counter and prescription medicines only as told by your health care provider.  Exercise regularly, as told by your health care provider. Avoid exercise starting several hours before bedtime.  Use relaxation techniques to manage stress. Ask your health care provider to suggest some techniques that may work well for you. These may include: ? Breathing exercises. ? Routines to release muscle tension. ? Visualizing peaceful scenes.  Make sure that you drive carefully. Avoid driving if you feel very sleepy.  Keep all follow-up visits as told by your health care provider. This is important. Contact a health care provider if:  You are tired throughout the day.  You have trouble in your daily routine due to sleepiness.  You continue to have sleep problems, or your sleep problems get worse. Get help right away if:  You have serious thoughts about hurting yourself or someone else. If you ever feel like you may hurt yourself or others, or have thoughts about taking your own life, get help right away. You can go to your nearest emergency department or call:  Your local emergency services (911 in the [place]  A suicide crisis helpline, such as the [place] at 847-737-3559. This is open 24 hours a day. Summary  Insomnia is a sleep disorder that makes it difficult to fall asleep or stay asleep.  Insomnia can be long-term (chronic) or short-term (acute).  Treatment for insomnia depends on the cause. Treatment may focus on treating an underlying  condition that is causing insomnia.  Keep a sleep diary to help you and your health care provider figure out what could be causing your insomnia. This information is not intended to replace advice given to you by your health care provider. Make sure you discuss any questions you have with your health care provider. Document Revised: 03/08/2017 Document Reviewed: 01/03/2017 Elsevier Patient Education  2020 [employer].

## 2019-08-19 NOTE — Progress Notes (Signed)
PATIENT: Karen Cannon DOB: 05-13-1955  REASON FOR VISIT: follow up HISTORY FROM: patient  Chief Complaint  Patient presents with  . Follow-up    Pt in rm 1 here for a medication f/u. Pt has no new sx. Pt is also here for a cpap f/u     HISTORY OF PRESENT ILLNESS: Today 08/19/19 Karen Cannon is a 64 y.o. female here today for follow up for insomnia and complex sleep apnea. She continues BiPAP therapy nightly. Compliance report in 02/2019 showed excellent compliance. She continues Ambien 10mg  and trazodone 50-100mg  at bedtime. No adverse effects noted. She has tried to decrease Ambien dose over the past week. She has taken 5mg  every night and 50mg  of trazodone for the past week. She seems to be doing ok. Headaches and memory are stable. She walks 5 days a week. She plays brain games frequently. She is feeling well today and without complaints.   Compliance report dated 07/20/2019 through 08/18/2019 reveals that she is used BiPAP therapy every night for compliance of 100%.  She is used BiPAP greater than 4 hours every night for compliance of 100%.  Average usage was 8 hours and 26 minutes.  Residual AHI remains at 7.7 with max IPAP of 21 cm of water, minimum EPAP of 8 cm of water and pressure support of 5 cm of water.  Leak in the 95th percentile of 16.5 L/min.  3.5 central apneas and 3.6 obstructive apneas noted.  HISTORY: (copied from my note on 02/11/2019)  Karen Cannon is a 64 y.o. 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 She reports doing well. She is sleeping better. She continues Ambien 10mg  and trazodone 50-100mg  at night (usually 50mg ). She denies any adverse effects with medications. She is using BiPAP nightly. She has monitored events each night and reports about 7-8 events/hr on average.  Compliance report dated 01/10/2019 through 02/08/2019 reveals that she has used BiPAP therapy 30 out of the last 30 days for compliance of 100%.  All 30 days  she used BiPAP greater than 4 hours.  Average usage was 8 hours and 34 minutes.  AHI was 7.7 with max IPAP of 21 cm of water and minimum EPAP of 8 cmH2O.  Pressure support of 5 cm of water.  There was no significant leak noted.  Approximately 3.8 central and 3.6 obstructive apneas.  HISTORY: (copied from Dr Garth Bigness note on 08/18/2018)  Since the last visit, she had a BiPAP titration. She did not meet criteria for ASV or ST modes. A set BiPAP level was placed on her machine,+11/7. She reports doing well on BiPAP. AHI was 15-18 the last few times that she has checked. Of note, the in lab titration showed that she had many shallow breaths without desaturations and these may have been over counted by her machine.  Her insomnia does well on Ambien 10 mg and 1/2 trazodone. She notes mild daytime sleepiness. She never took armodafinil  She notes a mild tremor in her hands.She does not have a family history. It does not prevent her from doing tasks with her hands.  Headaches are better. They seem to come and go for periods of time and currently is doing better. She notes some neck pain.    REVIEW OF SYSTEMS: Out of a complete 14 system review of symptoms, the patient complains only of the following symptoms, insomnia, headaches and all other reviewed systems are negative.  ALLERGIES: Allergies  Allergen Reactions  .  Isovue [Iopamidol] Itching    Throat itching, tightness  . Iodine   . Simvastatin     cramps    HOME MEDICATIONS: Outpatient Medications Prior to Visit  Medication Sig Dispense Refill  . B Complex Vitamins (VITAMIN-B COMPLEX) TABS Reported on 06/01/2015    . escitalopram (LEXAPRO) 20 MG tablet Take 10 mg by mouth daily.     Marland Kitchen LORazepam (ATIVAN) 0.5 MG tablet TAKE 1 TABLET (0.5 MG TOTAL) BY MOUTH EVERY 8 (EIGHT) HOURS AS NEEDED FOR ANXIETY.  0  . ondansetron (ZOFRAN) 4 MG tablet Take 4 mg by mouth every 6 (six) hours as needed.    . pantoprazole (PROTONIX) 40  MG tablet Take 40 mg by mouth daily.    . Pravastatin Sodium (PRAVACHOL PO) Take 20 mg by mouth daily.     . traZODone (DESYREL) 100 MG tablet TAKE 1/2 TO 1 TABLET BY MOUTH AT BEDTIME 90 tablet 4  . zolpidem (AMBIEN) 10 MG tablet Take 1 tablet (10 mg total) by mouth at bedtime as needed. 90 tablet 1  . Ascorbic Acid (VITAMIN C) 100 MG tablet Take by mouth.    . calcium carbonate (OS-CAL) 1250 (500 Ca) MG chewable tablet Chew by mouth.     No facility-administered medications prior to visit.    PAST MEDICAL HISTORY: Past Medical History:  Diagnosis Date  . Anxiety   . Cancer ([place])   . GERD (gastroesophageal reflux disease)   . High cholesterol   . History of kidney cancer   . Hypertension   . Sleep apnea     PAST SURGICAL HISTORY: Past Surgical History:  Procedure Laterality Date  . HERNIA REPAIR    . kidney cancer surgery    . REPLACEMENT TOTAL KNEE      FAMILY HISTORY: History reviewed. No pertinent family history.  SOCIAL HISTORY: Social History   Socioeconomic History  . Marital status: Married    Spouse name: Not on file  . Number of children: 3  . Years of education: Masters  . Highest education level: Not on file  Occupational History  . Not on file  Tobacco Use  . Smoking status: Never Smoker  . Smokeless tobacco: Never Used  Substance and Sexual Activity  . Alcohol use: No    Alcohol/week: 0.0 standard drinks  . Drug use: No  . Sexual activity: Yes    Birth control/protection: Post-menopausal  Other Topics Concern  . Not on file  Social History Narrative   Patient is married with one child.   Patient is left handed.   Patient has her Master's degree.   Patient drinks 2 cups daily.   Social Determinants of Health   Financial Resource Strain:   . Difficulty of Paying Living Expenses:   Food Insecurity:   . Worried About Charity fundraiser in the Last Year:   . Arboriculturist in the Last Year:   Transportation Needs:   . Lexicographer (Medical):   Marland Kitchen Lack of Transportation (Non-Medical):   Physical Activity:   . Days of Exercise per Week:   . Minutes of Exercise per Session:   Stress:   . Feeling of Stress :   Social Connections:   . Frequency of Communication with Friends and Family:   . Frequency of Social Gatherings with Friends and Family:   . Attends Religious Services:   . Active Member of Clubs or Organizations:   . Attends Archivist Meetings:   Marland Kitchen Marital  Status:   Intimate Partner Violence:   . Fear of Current or Ex-Partner:   . Emotionally Abused:   Marland Kitchen Physically Abused:   . Sexually Abused:       PHYSICAL EXAM  Vitals:   08/19/19 1001  BP: 125/79  Pulse: 66  Temp: (!) 97.1 F (36.2 C)  Weight: 168 lb (76.2 kg)  Height: 5\' 3"  (1.6 m)   Body mass index is 29.76 kg/m.  Generalized: Well developed, in no acute distress  Cardiology: normal rate and rhythm, no murmur noted Respiratory: clear to auscultation bilaterally  Neurological examination  Mentation: Alert oriented to time, place, history taking. Follows all commands speech and language fluent Cranial nerve II-XII: Pupils were equal round reactive to light. Extraocular movements were full, visual field were full  Motor: The motor testing reveals 5 over 5 strength of all 4 extremities. Good symmetric motor tone is noted throughout.  Gait and station: Gait is normal.   DIAGNOSTIC DATA (LABS, IMAGING, TESTING) - I reviewed patient records, labs, notes, testing and imaging myself where available.  No flowsheet data found.   Lab Results  Component Value Date   WBC 5.8 08/27/2015   HGB 12.8 08/27/2015   HCT 38.3 08/27/2015   MCV 97.2 08/27/2015   PLT 283 08/27/2015      Component Value Date/Time   NA 141 08/27/2015 1440   K 3.4 (L) 08/27/2015 1440   CL 106 08/27/2015 1440   CO2 27 08/27/2015 1440   GLUCOSE 92 08/27/2015 1440   BUN 9 08/27/2015 1440   CREATININE 0.49 08/27/2015 1440   CALCIUM 9.7  08/27/2015 1440   PROT 7.1 08/27/2015 1440   ALBUMIN 4.1 08/27/2015 1440   AST 17 08/27/2015 1440   ALT 17 08/27/2015 1440   ALKPHOS 65 08/27/2015 1440   BILITOT 0.5 08/27/2015 1440   GFRNONAA >60 08/27/2015 1440   GFRAA >60 08/27/2015 1440   No results found for: CHOL, HDL, LDLCALC, LDLDIRECT, TRIG, CHOLHDL No results found for: HGBA1C No results found for: VITAMINB12 No results found for: TSH     ASSESSMENT AND PLAN 64 y.o. year old female  has a past medical history of Anxiety, Cancer ([place]), GERD (gastroesophageal reflux disease), High cholesterol, History of kidney cancer, Hypertension, and Sleep apnea. here with     ICD-10-CM   1. Sleep apnea treated with nocturnal BiPAP  G47.30 For home use only DME Bip[facility]  2. Complex sleep apnea syndrome  G47.31   3. Insomnia, unspecified type  G47.00   4. Other headache syndrome  G44.89     Lakia is doing well today.  She continues BiPAP therapy nightly.  Compliance report reveals excellent compliance.  Residual AHI does remain slightly elevated at 7.7.  Previous titration study showed multiple shallow breaths without hypoxemia.  Ambien could also be playing a role.  Leak is acceptable.  She is feeling well rested when waking.  We will continue current settings.  We will continue to monitor AHI.  She was encouraged to continue 5 mg dosing of Ambien if well tolerated.  She will continue to use 10 mg tablets for now until we see how she adjusts.  We will continue trazodone 50 mg at night.  100 mg dosing makes her feel too groggy even when not taken with Ambien.  She was encouraged to continue regular physical and mental activity.  Memory compensation strategies reviewed.  Adequate hydration and well-balanced diet encouraged.  She will follow-up with me in 6 months, sooner if  needed.  She verbalizes understanding and agreement with this plan.   Orders Placed This Encounter  Procedures  . For home use only DME Bipap    Supplies    Order  Specific Question:   Length of Need    Answer:   Lifetime    Order Specific Question:   Inspiratory pressure    Answer:   OTHER SEE COMMENTS    Order Specific Question:   Expiratory pressure    Answer:   OTHER SEE COMMENTS     Meds ordered this encounter  Medications  . traZODone (DESYREL) 50 MG tablet    Sig: Take 1 tablet at bedtime    Dispense:  90 tablet    Refill:  3    Order Specific Question:   Supervising Provider    Answer:   Melvenia Beam [id number]  . zolpidem (AMBIEN) 10 MG tablet    Sig: Take 1 tablet (10 mg total) by mouth at bedtime as needed.    Dispense:  90 tablet    Refill:  1    Order Specific Question:   Supervising Provider    Answer:   Melvenia Beam [id number]      I spent 15 minutes with the patient. 50% of this time was spent counseling and educating patient on plan of care and medications.    Debbora Presto, FNP-C 08/19/2019, 10:57 AM [facility] [address] [place], [place] [address] [phone]

## 2019-08-19 NOTE — Progress Notes (Signed)
DME orders faxed to [facility] at [phone]

## 2020-02-16 ENCOUNTER — Telehealth: Payer: Medicare HMO | Admitting: Family Medicine

## 2020-06-22 ENCOUNTER — Telehealth: Payer: Self-pay | Admitting: Family Medicine

## 2020-06-22 NOTE — Telephone Encounter (Signed)
Pt called stating that her family doctor has put her on Cymbalta and now can not take her traZODone (DESYREL) 50 MG tablet with it. Pt states that she no longer takes zolpidem (AMBIEN) 10 MG tablet and would like to know what other sleep medicine she can take with the Cymbalta. Please advise.

## 2020-06-22 NOTE — Telephone Encounter (Signed)
I called pt and relayed that taking melatonin 5mg  otc or valerian root 450-600mg  daily may help sleeping.  She took the cymbalta and trazadone for 4 days with no issues.  She is concerned and will not take together until after speaking to you about this.  Will try 5 mg melatonin vs 10mg  to see if makes difference then if not will take valerian root.  Has appt 07-14-20 will discuss then.  Taking cymbalta due to back pain and depression.

## 2020-06-22 NOTE — Telephone Encounter (Signed)
She can try melatonin 5mg  OTC or valerian root 450-600mg  daily until we can see her back in the office. Some folks do just fine on Cymbalta and trazodone. As long as she is not having any symptoms, I would be ok to continue trazodone at low doses.

## 2020-07-14 ENCOUNTER — Ambulatory Visit: Payer: Medicare HMO | Admitting: Family Medicine

## 2020-07-14 ENCOUNTER — Encounter: Payer: Self-pay | Admitting: Family Medicine

## 2020-07-14 VITALS — BP 114/71 | HR 75 | Ht 63.0 in | Wt 169.0 lb

## 2020-07-14 DIAGNOSIS — G4731 Primary central sleep apnea: Secondary | ICD-10-CM | POA: Diagnosis not present

## 2020-07-14 MED ORDER — TRAZODONE HCL 50 MG PO TABS: ORAL_TABLET | ORAL | 3 refills | Status: DC

## 2020-07-14 NOTE — Progress Notes (Signed)
I have read the note, and I agree with the clinical assessment and plan.  Standley Bargo A. Phyliss Hulick, MD, PhD, FAAN Certified in Neurology, Clinical Neurophysiology, Sleep Medicine, Pain Medicine and Neuroimaging  [facility] [address], [address] [place], [place] [address] [phone]  

## 2020-07-14 NOTE — Progress Notes (Signed)
PATIENT: Karen Cannon DOB: [birth year]  REASON FOR VISIT: follow up HISTORY FROM: patient  Chief Complaint  Patient presents with  . Obstructive Sleep Apnea    RM 1 alone Pt is well, BiPAP is helping, sleeps most of the night.      HISTORY OF PRESENT ILLNESS: 07/14/20 ALL: She returns for follow up for complex sleep apnea on BiPAP therapy and insomnia. She has discontinued Ambien due to concerns of a link to dementia mentioned by PCP. She continues trazodone 50mg  at bedtime. PCP has added duloxetine 20-40mg  daily and discontinued escitalopram. She does feel depression is improving. She has not increased dose to 40. She is sleeping fairly well. She has lost 20 pounds.   She is doing well on BiPAP therapy. She is using therapy every night.      08/19/2019 ALL:  Karen Cannon is a 65 y.o. female here today for follow up for insomnia and complex sleep apnea. She continues BiPAP therapy nightly. Compliance report in 02/2019 showed excellent compliance. She continues Ambien 10mg  and trazodone 50-100mg  at bedtime. No adverse effects noted. She has tried to decrease Ambien dose over the past week. She has taken 5mg  every night and 50mg  of trazodone for the past week. She seems to be doing ok. Headaches and memory are stable. She walks 5 days a week. She plays brain games frequently. She is feeling well today and without complaints.   Compliance report dated 07/20/2019 through 08/18/2019 reveals that she is used BiPAP therapy every night for compliance of 100%.  She is used BiPAP greater than 4 hours every night for compliance of 100%.  Average usage was 8 hours and 26 minutes.  Residual AHI remains at 7.7 with max IPAP of 21 cm of water, minimum EPAP of 8 cm of water and pressure support of 5 cm of water.  Leak in the 95th percentile of 16.5 L/min.  3.5 central apneas and 3.6 obstructive apneas noted.  HISTORY: (copied from my note on 02/11/2019)  Karen Cannon is a 65 y.o. female here  today for follow up of complex sleep apnea on BiPAP and insomnia. She reports doing well. She is sleeping better. She continues Ambien 10mg  and trazodone 50-100mg  at night (usually 50mg ). She denies any adverse effects with medications. She is using BiPAP nightly. She has monitored events each night and reports about 7-8 events/hr on average.  Compliance report dated 01/10/2019 through 02/08/2019 reveals that she has used BiPAP therapy 30 out of the last 30 days for compliance of 100%.  All 30 days she used BiPAP greater than 4 hours.  Average usage was 8 hours and 34 minutes.  AHI was 7.7 with max IPAP of 21 cm of water and minimum EPAP of 8 cmH2O.  Pressure support of 5 cm of water.  There was no significant leak noted.  Approximately 3.8 central and 3.6 obstructive apneas.  HISTORY: (copied from Dr Garth Bigness note on 08/18/2018)  Since the last visit, she had a BiPAP titration. She did not meet criteria for ASV or ST modes. A set BiPAP level was placed on her machine,+11/7. She reports doing well on BiPAP. AHI was 15-18 the last few times that she has checked. Of note, the in lab titration showed that she had many shallow breaths without desaturations and these may have been over counted by her machine.  Her insomnia does well on Ambien 10 mg and 1/2 trazodone. She notes mild daytime sleepiness. She never took armodafinil  She notes  a mild tremor in her hands.She does not have a family history. It does not prevent her from doing tasks with her hands.  Headaches are better. They seem to come and go for periods of time and currently is doing better. She notes some neck pain.    REVIEW OF SYSTEMS: Out of a complete 14 system review of symptoms, the patient complains only of the following symptoms, insomnia, headaches and all other reviewed systems are negative.  ALLERGIES: Allergies  Allergen Reactions  . Isovue [Iopamidol] Itching    Throat itching, tightness  . Iodine   .  Simvastatin     cramps    HOME MEDICATIONS: Outpatient Medications Prior to Visit  Medication Sig Dispense Refill  . B Complex Vitamins (VITAMIN-B COMPLEX) TABS Reported on 06/01/2015    . DULoxetine (CYMBALTA) 20 MG capsule Take 20 mg by mouth daily.    Marland Kitchen LORazepam (ATIVAN) 0.5 MG tablet TAKE 1 TABLET (0.5 MG TOTAL) BY MOUTH EVERY 8 (EIGHT) HOURS AS NEEDED FOR ANXIETY.  0  . ondansetron (ZOFRAN) 4 MG tablet Take 4 mg by mouth every 6 (six) hours as needed.    . Pravastatin Sodium (PRAVACHOL PO) Take 20 mg by mouth daily.     . traZODone (DESYREL) 50 MG tablet Take 1 tablet at bedtime 90 tablet 3  . escitalopram (LEXAPRO) 20 MG tablet Take 10 mg by mouth daily.     . pantoprazole (PROTONIX) 40 MG tablet Take 40 mg by mouth daily.    Marland Kitchen zolpidem (AMBIEN) 10 MG tablet Take 1 tablet (10 mg total) by mouth at bedtime as needed. 90 tablet 1   No facility-administered medications prior to visit.    PAST MEDICAL HISTORY: Past Medical History:  Diagnosis Date  . Anxiety   . Cancer ([place])   . GERD (gastroesophageal reflux disease)   . High cholesterol   . History of kidney cancer   . Hypertension   . Sleep apnea     PAST SURGICAL HISTORY: Past Surgical History:  Procedure Laterality Date  . HERNIA REPAIR    . kidney cancer surgery    . REPLACEMENT TOTAL KNEE      FAMILY HISTORY: History reviewed. No pertinent family history.  SOCIAL HISTORY: Social History   Socioeconomic History  . Marital status: Married    Spouse name: Not on file  . Number of children: 3  . Years of education: Masters  . Highest education level: Not on file  Occupational History  . Not on file  Tobacco Use  . Smoking status: Never Smoker  . Smokeless tobacco: Never Used  Substance and Sexual Activity  . Alcohol use: No    Alcohol/week: 0.0 standard drinks  . Drug use: No  . Sexual activity: Yes    Birth control/protection: Post-menopausal  Other Topics Concern  . Not on file  Social History  Narrative   Patient is married with one child.   Patient is left handed.   Patient has her Master's degree.   Patient drinks 2 cups daily.   Social Determinants of Health   Financial Resource Strain: Not on file  Food Insecurity: Not on file  Transportation Needs: Not on file  Physical Activity: Not on file  Stress: Not on file  Social Connections: Not on file  Intimate Partner Violence: Not on file      PHYSICAL EXAM  Vitals:   07/14/20 0850  BP: 114/71  Pulse: 75  Weight: 169 lb (76.7 kg)  Height: 5\' 3"  (  1.6 m)   Body mass index is 29.94 kg/m.  Generalized: Well developed, in no acute distress  Cardiology: normal rate and rhythm, no murmur noted Respiratory: clear to auscultation bilaterally  Neurological examination  Mentation: Alert oriented to time, place, history taking. Follows all commands speech and language fluent Cranial nerve II-XII: Pupils were equal round reactive to light. Extraocular movements were full, visual field were full  Motor: The motor testing reveals 5 over 5 strength of all 4 extremities. Good symmetric motor tone is noted throughout.  Gait and station: Gait is normal.   DIAGNOSTIC DATA (LABS, IMAGING, TESTING) - I reviewed patient records, labs, notes, testing and imaging myself where available.  No flowsheet data found.   Lab Results  Component Value Date   WBC 5.8 08/27/2015   HGB 12.8 08/27/2015   HCT 38.3 08/27/2015   MCV 97.2 08/27/2015   PLT 283 08/27/2015      Component Value Date/Time   NA 141 08/27/2015 1440   K 3.4 (L) 08/27/2015 1440   CL 106 08/27/2015 1440   CO2 27 08/27/2015 1440   GLUCOSE 92 08/27/2015 1440   BUN 9 08/27/2015 1440   CREATININE 0.49 08/27/2015 1440   CALCIUM 9.7 08/27/2015 1440   PROT 7.1 08/27/2015 1440   ALBUMIN 4.1 08/27/2015 1440   AST 17 08/27/2015 1440   ALT 17 08/27/2015 1440   ALKPHOS 65 08/27/2015 1440   BILITOT 0.5 08/27/2015 1440   GFRNONAA >60 08/27/2015 1440   GFRAA >60  08/27/2015 1440   No results found for: CHOL, HDL, LDLCALC, LDLDIRECT, TRIG, CHOLHDL No results found for: HGBA1C No results found for: VITAMINB12 No results found for: TSH     ASSESSMENT AND PLAN 65 y.o. year old female  has a past medical history of Anxiety, Cancer ([place]), GERD (gastroesophageal reflux disease), High cholesterol, History of kidney cancer, Hypertension, and Sleep apnea. here with     ICD-10-CM   1. Complex sleep apnea syndrome  G47.31 For home use only DME Bip[facility]     Shaundrea is doing well today.  She continues BiPAP therapy nightly.  Compliance report reveals excellent compliance.  Residual AHI at goal.  She has lost 20 pounds.  We will continue trazodone 50 mg at night. Zolpidem discontinued. She was encouraged to continue regular physical and mental activity. Adequate hydration and well-balanced diet encouraged.  She will follow-up with me in 1 year, sooner if needed.  She verbalizes understanding and agreement with this plan.   Orders Placed This Encounter  Procedures  . For home use only DME Bipap    Supplies    Order Specific Question:   Length of Need    Answer:   Lifetime    Order Specific Question:   Inspiratory pressure    Answer:   OTHER SEE COMMENTS    Order Specific Question:   Expiratory pressure    Answer:   OTHER SEE COMMENTS     No orders of the defined types were placed in this encounter.     I spent 15 minutes with the patient. 50% of this time was spent counseling and educating patient on plan of care and medications.    Debbora Presto, FNP-C 07/14/2020, 9:15 AM [facility] [address], [place], [place] [address] [phone]

## 2020-07-14 NOTE — Patient Instructions (Signed)
Please continue using your BiPAP regularly. While your insurance requires that you use BiPAP at least 4 hours each night on 70% of the nights, I recommend, that you not skip any nights and use it throughout the night if you can. Getting used to BiPAP and staying with the treatment long term does take time and patience and discipline. Untreated obstructive sleep apnea when it is moderate to severe can have an adverse impact on cardiovascular health and raise her risk for heart disease, arrhythmias, hypertension, congestive heart failure, stroke and diabetes. Untreated obstructive sleep apnea causes sleep disruption, nonrestorative sleep, and sleep deprivation. This can have an impact on your day to day functioning and cause daytime sleepiness and impairment of cognitive function, memory loss, mood disturbance, and problems focussing. Using BiPAP regularly can improve these symptoms.   Follow up in 1 year   Quality Sleep Information, Adult Quality sleep is important for your mental and physical health. It also improves your quality of life. Quality sleep means you:  Are asleep for most of the time you are in bed.  Fall asleep within 30 minutes.  Wake up no more than once a night.  Are awake for no longer than 20 minutes if you do wake up during the night. Most adults need 7-8 hours of quality sleep each night. How can poor sleep affect me? If you do not get enough quality sleep, you may have:  Mood swings.  Daytime sleepiness.  Confusion.  Decreased reaction time.  Sleep disorders, such as insomnia and sleep apnea.  Difficulty with: ? Solving problems. ? Coping with stress. ? Paying attention. These issues may affect your performance and productivity at work, school, and at home. Lack of sleep may also put you at higher risk for accidents, suicide, and risky behaviors. If you do not get quality sleep you may also be at higher risk for several health problems,  including:  Infections.  Type 2 diabetes.  Heart disease.  High blood pressure.  Obesity.  Worsening of long-term conditions, like arthritis, kidney disease, depression, Parkinson's disease, and epilepsy. What actions can I take to get more quality sleep?  Stick to a sleep schedule. Go to sleep and wake up at about the same time each day. Do not try to sleep less on weekdays and make up for lost sleep on weekends. This does not work.  Try to get about 30 minutes of exercise on most days. Do not exercise 2-3 hours before going to bed.  Limit naps during the day to 30 minutes or less.  Do not use any products that contain nicotine or tobacco, such as cigarettes or e-cigarettes. If you need help quitting, ask your health care provider.  Do not drink caffeinated beverages for at least 8 hours before going to bed. Coffee, tea, and some sodas contain caffeine.  Do not drink alcohol close to bedtime.  Do not eat large meals close to bedtime.  Do not take naps in the late afternoon.  Try to get at least 30 minutes of sunlight every day. Morning sunlight is best.  Make time to relax before bed. Reading, listening to music, or taking a hot bath promotes quality sleep.  Make your bedroom a place that promotes quality sleep. Keep your bedroom dark, quiet, and at a comfortable room temperature. Make sure your bed is comfortable. Take out sleep distractions like TV, a computer, smartphone, and bright lights.  If you are lying awake in bed for longer than 20 minutes, get  up and do a relaxing activity until you feel sleepy.  Work with your health care provider to treat medical conditions that may affect sleeping, such as: ? Nasal obstruction. ? Snoring. ? Sleep apnea and other sleep disorders.  Talk to your health care provider if you think any of your prescription medicines may cause you to have difficulty falling or staying asleep.  If you have sleep problems, talk with a sleep  consultant. If you think you have a sleep disorder, talk with your health care provider about getting evaluated by a specialist.      Where to find more information  [place] website: https://sleepfoundation.org  National Heart, Lung, and [place] ([place]): http://www.saunders.info/.pdf  Centers for Disease Control and Prevention (CDC): LearningDermatology.pl Contact a health care provider if you:  Have trouble getting to sleep or staying asleep.  Often wake up very early in the morning and cannot get back to sleep.  Have daytime sleepiness.  Have daytime sleep attacks of suddenly falling asleep and sudden muscle weakness (narcolepsy).  Have a tingling sensation in your legs with a strong urge to move your legs (restless legs syndrome).  Stop breathing briefly during sleep (sleep apnea).  Think you have a sleep disorder or are taking a medicine that is affecting your quality of sleep. Summary  Most adults need 7-8 hours of quality sleep each night.  Getting enough quality sleep is an important part of health and well-being.  Make your bedroom a place that promotes quality sleep and avoid things that may cause you to have poor sleep, such as alcohol, caffeine, smoking, and large meals.  Talk to your health care provider if you have trouble falling asleep or staying asleep. This information is not intended to replace advice given to you by your health care provider. Make sure you discuss any questions you have with your health care provider. Document Revised: 07/03/2017 Document Reviewed: 07/03/2017 Elsevier Patient Education  2021 [place].   Sleep Apnea Sleep apnea affects breathing during sleep. It causes breathing to stop for a short time or to become shallow. It can also increase the risk of:  Heart attack.  Stroke.  Being very overweight (obese).  Diabetes.  Heart failure.  Irregular  heartbeat. The goal of treatment is to help you breathe normally again. What are the causes? There are three kinds of sleep apnea:  Obstructive sleep apnea. This is caused by a blocked or collapsed airway.  Central sleep apnea. This happens when the brain does not send the right signals to the muscles that control breathing.  Mixed sleep apnea. This is a combination of obstructive and central sleep apnea. The most common cause of this condition is a collapsed or blocked airway. This can happen if:  Your throat muscles are too relaxed.  Your tongue and tonsils are too large.  You are overweight.  Your airway is too small.   What increases the risk?  Being overweight.  Smoking.  Having a small airway.  Being older.  Being female.  Drinking alcohol.  Taking medicines to calm yourself (sedatives or tranquilizers).  Having family members with the condition. What are the signs or symptoms?  Trouble staying asleep.  Being sleepy or tired during the day.  Getting angry a lot.  Loud snoring.  Headaches in the morning.  Not being able to focus your mind (concentrate).  Forgetting things.  Less interest in sex.  Mood swings.  Personality changes.  Feelings of sadness (depression).  Waking  up a lot during the night to pee (urinate).  Dry mouth.  Sore throat. How is this diagnosed?  Your medical history.  A physical exam.  A test that is done when you are sleeping (sleep study). The test is most often done in a sleep lab but may also be done at home. How is this treated?  Sleeping on your side.  Using a medicine to get rid of mucus in your nose (decongestant).  Avoiding the use of alcohol, medicines to help you relax, or certain pain medicines (narcotics).  Losing weight, if needed.  Changing your diet.  Not smoking.  Using a machine to open your airway while you sleep, such as: ? An oral appliance. This is a mouthpiece that shifts your lower  jaw forward. ? A CPAP device. This device blows air through a mask when you breathe out (exhale). ? An EPAP device. This has valves that you put in each nostril. ? A BPAP device. This device blows air through a mask when you breathe in (inhale) and breathe out.  Having surgery if other treatments do not work. It is important to get treatment for sleep apnea. Without treatment, it can lead to:  High blood pressure.  Coronary artery disease.  In men, not being able to have an erection (impotence).  Reduced thinking ability.   Follow these instructions at home: Lifestyle  Make changes that your doctor recommends.  Eat a healthy diet.  Lose weight if needed.  Avoid alcohol, medicines to help you relax, and some pain medicines.  Do not use any products that contain nicotine or tobacco, such as cigarettes, e-cigarettes, and chewing tobacco. If you need help quitting, ask your doctor. General instructions  Take over-the-counter and prescription medicines only as told by your doctor.  If you were given a machine to use while you sleep, use it only as told by your doctor.  If you are having surgery, make sure to tell your doctor you have sleep apnea. You may need to bring your device with you.  Keep all follow-up visits as told by your doctor. This is important. Contact a doctor if:  The machine that you were given to use during sleep bothers you or does not seem to be working.  You do not get better.  You get worse. Get help right away if:  Your chest hurts.  You have trouble breathing in enough air.  You have an uncomfortable feeling in your back, arms, or stomach.  You have trouble talking.  One side of your body feels weak.  A part of your face is hanging down. These symptoms may be an emergency. Do not wait to see if the symptoms will go away. Get medical help right away. Call your local emergency services (911 in the [place] Do not drive yourself to the  hospital. Summary  This condition affects breathing during sleep.  The most common cause is a collapsed or blocked airway.  The goal of treatment is to help you breathe normally while you sleep. This information is not intended to replace advice given to you by your health care provider. Make sure you discuss any questions you have with your health care provider. Document Revised: 01/10/2018 Document Reviewed: 11/19/2017 Elsevier Patient Education  [place].

## 2021-12-06 ENCOUNTER — Encounter: Payer: Self-pay | Admitting: Neurology

## 2021-12-06 ENCOUNTER — Ambulatory Visit: Payer: Medicare HMO | Admitting: Neurology

## 2021-12-06 VITALS — BP 130/89 | HR 81 | Ht 63.0 in | Wt 175.0 lb

## 2021-12-06 DIAGNOSIS — G4489 Other headache syndrome: Secondary | ICD-10-CM

## 2021-12-06 DIAGNOSIS — G4731 Primary central sleep apnea: Secondary | ICD-10-CM

## 2021-12-06 DIAGNOSIS — M542 Cervicalgia: Secondary | ICD-10-CM

## 2021-12-06 DIAGNOSIS — G4700 Insomnia, unspecified: Secondary | ICD-10-CM | POA: Diagnosis not present

## 2021-12-06 MED ORDER — IMIPRAMINE HCL 25 MG PO TABS
25.0000 mg | ORAL_TABLET | Freq: Every day | ORAL | 3 refills | Status: DC
Start: 2021-12-06 — End: 2022-01-16

## 2021-12-06 NOTE — Progress Notes (Signed)
[facility]  PATIENT: Karen Cannon DOB: [birth year]  REFERRING CLINICIAN: Dr. Jefm Petty HISTORY FROM: Patient REASON FOR VISIT: Obstructive sleep apnea   Chief Complaint   Follow-up     HISTORICAL  CHIEF COMPLAINT:  Chief Complaint  Patient presents with   Follow-up    Pt alone, rm 2. Presents today for follow up. Using sleep machine. DME [employer].     HISTORY OF PRESENT ILLNESS:  She is a 66 y.o. woman with severe obstructive sleep apnea with some central sleep apneas.   Update 12/06/2021  History of Present Illness: She is on VPAP 21/8/5 and has 100% compliance and AHI = 3.8.     She has chronic .  A set BiPAP level was placed on her machine, +11/7.  She reports doing well on BiPAP.    AHI was 15-18 the last few times that she has checked.  Of note, the in lab titration showed that she had many shallow breaths without desaturations and these may have been over counted by her machine.  Her insomnia does well on Ambien 10 mg and 1/2 trazodone.   She notes mild daytime sleepiness.  She never took armodafinil  She notes a mild tremor in her hands.  She does not have a family history.  It does not prevent her from doing tasks with her hands.  Headaches are more frequent..   They seem to come and go for periods of time and currently is doing better.   She notes some neck pain.   No N/V, photophobia or phonophobia.     Movements do not make a difference.  She usually does not take anything.     She has some insomnia but does well with trazodone 25 mg nightly.    She stopped the Ambien.        EPWORTH SLEEPINESS SCALE  On a scale of 0 - 3 what is the chance of dozing:  Sitting and Reading:   0 Watching TV:    0 Sitting inactive in a public place: 0 Passenger in car for one hour: 0 Lying down to rest in the afternoon: 0 Sitting and talking to someone: 0 Sitting quietly after lunch:  0 In a car, stopped in traffic:  0  Total (out of 24):     0/24    no EDS     REVIEW OF SYSTEMS: Full 14 system review of systems performed and notable only for insomnia. She reports headaches  ALLERGIES: Allergies  Allergen Reactions   Isovue [Iopamidol] Itching    Throat itching, tightness   Iodine    Simvastatin     cramps    HOME MEDICATIONS: Outpatient Medications Prior to Visit  Medication Sig Dispense Refill   azelastine (ASTELIN) 0.1 % nasal spray Place into the nose.     calcium carbonate (OS-CAL) 1250 (500 Ca) MG chewable tablet Chew by mouth.     escitalopram (LEXAPRO) 10 MG tablet Take 10 mg by mouth daily.     estradiol (ESTRACE) 0.1 MG/GM vaginal cream Insert 1 applicatorful twice a week by vaginal route.     ipratropium (ATROVENT) 0.06 % nasal spray Place into the nose.     ondansetron (ZOFRAN) 4 MG tablet Take 4 mg by mouth every 6 (six) hours as needed.     pantoprazole (PROTONIX) 20 MG tablet Take 20 mg by mouth daily.     Pravastatin Sodium (PRAVACHOL PO) Take 20 mg by mouth daily.  traMADol (ULTRAM) 50 MG tablet Take 50 mg by mouth every 6 (six) hours as needed.     traZODone (DESYREL) 50 MG tablet Take 1 tablet at bedtime (Patient taking differently: Take 25 mg by mouth at bedtime.) 90 tablet 3   valsartan (DIOVAN) 80 MG tablet Take 80 mg by mouth daily.     B Complex Vitamins (VITAMIN-B COMPLEX) TABS Reported on 06/01/2015     DULoxetine (CYMBALTA) 20 MG capsule Take 20 mg by mouth daily.     LORazepam (ATIVAN) 0.5 MG tablet TAKE 1 TABLET (0.5 MG TOTAL) BY MOUTH EVERY 8 (EIGHT) HOURS AS NEEDED FOR ANXIETY.  0   No facility-administered medications prior to visit.    PAST MEDICAL HISTORY: Past Medical History:  Diagnosis Date   Anxiety    Cancer ([place])    GERD (gastroesophageal reflux disease)    High cholesterol    History of kidney cancer    Hypertension    Sleep apnea     PAST SURGICAL HISTORY: Past Surgical History:  Procedure Laterality Date   HERNIA REPAIR     kidney cancer surgery      REPLACEMENT TOTAL KNEE      FAMILY HISTORY: She denies a history of OSA in family members.  SOCIAL HISTORY:  Social History   Socioeconomic History   Marital status: Married    Spouse name: Not on file   Number of children: 3   Years of education: Masters   Highest education level: Not on file  Occupational History   Not on file  Tobacco Use   Smoking status: Never   Smokeless tobacco: Never  Substance and Sexual Activity   Alcohol use: No    Alcohol/week: 0.0 standard drinks of alcohol   Drug use: No   Sexual activity: Yes    Birth control/protection: Post-menopausal  Other Topics Concern   Not on file  Social History Narrative   Patient is married with one child.   Patient is left handed.   Patient has her Master's degree.   Patient drinks 2 cups daily.   Social Determinants of Health   Financial Resource Strain: Not on file  Food Insecurity: Not on file  Transportation Needs: Not on file  Physical Activity: Not on file  Stress: Not on file  Social Connections: Not on file  Intimate Partner Violence: Not on file     PHYSICAL EXAM  Vitals:   12/06/21 1001  BP: 130/89  Pulse: 81  Weight: 175 lb (79.4 kg)  Height: '5\' 3"'$  (1.6 m)    Body mass index is 31 kg/m.  General: The patient is well-developed and well-nourished and in no acute distress.  Rright occipital tenderness   Neurologic Exam  Mental status: The patient is alert and oriented x 3 at the time of the examination. The patient has apparent normal recent and remote memory, with an apparently normal attention span and concentration ability.   Speech is normal.  Cranial nerves: Extraocular movements are full.    Goof facial strngth and sensation.  Trapezius and sternocleidomastoid strength is normal. No dysarthria is noted.  T  No obvious hearing deficits are noted.  Motor:  Muscle bulk is normal.   Tone is normal. Strength is  5 / 5 in all 4 extremities.    Coordination: Good finger nose  finger and heel to shin.    Gait and station: Station is normal.   Gait is normal. Tandem gait is normal for age.    Reflexes:  Deep tendon reflexes are symmetric and normal bilaterally.        ASSESSMENT AND PLAN   Complex sleep apnea syndrome  Insomnia, unspecified type  Neck pain  Other headache syndrome      1.   Continue VAuto with maximum IPAP 21 cm H2O, minimum EPAP 8 cm H2O and 5 cm H2O pressure support. 2.   D/c  trazodone and start imipramine to try to help HA more. 3.   Stay active and exercise as toerted.    Return to see me in 6 months or sooner if there are new or worsening neurologic or sleep issues.   Britt Bottom M.D. PhD 7/33/1250, 87:19 AM Certified in Neurology, Sleep Medicine , Neurophysiology and Neuroimaging  [facility] [address] [place], [place] [address] [phone]

## 2021-12-11 ENCOUNTER — Encounter: Payer: Self-pay | Admitting: Neurology

## 2021-12-12 ENCOUNTER — Other Ambulatory Visit: Payer: Self-pay | Admitting: *Deleted

## 2021-12-12 MED ORDER — TRAZODONE HCL 50 MG PO TABS: 25.0000 mg | ORAL_TABLET | Freq: Every day | ORAL | 11 refills | Status: DC

## 2021-12-21 ENCOUNTER — Encounter: Payer: Self-pay | Admitting: Neurology

## 2021-12-24 ENCOUNTER — Other Ambulatory Visit: Payer: Self-pay | Admitting: *Deleted

## 2021-12-24 MED ORDER — ZONISAMIDE 100 MG PO CAPS: 100.0000 mg | ORAL_CAPSULE | Freq: Every day | ORAL | 3 refills | Status: DC

## 2022-01-04 ENCOUNTER — Other Ambulatory Visit: Payer: Self-pay | Admitting: Neurology

## 2022-01-08 ENCOUNTER — Ambulatory Visit: Payer: BC Managed Care – PPO | Admitting: Urology

## 2022-01-08 ENCOUNTER — Encounter: Payer: Self-pay | Admitting: Urology

## 2022-01-08 ENCOUNTER — Ambulatory Visit: Payer: Medicare HMO | Admitting: Urology

## 2022-01-08 VITALS — BP 133/90 | HR 94 | Ht 63.0 in | Wt 172.0 lb

## 2022-01-08 DIAGNOSIS — Z85528 Personal history of other malignant neoplasm of kidney: Secondary | ICD-10-CM

## 2022-01-08 DIAGNOSIS — C642 Malignant neoplasm of left kidney, except renal pelvis: Secondary | ICD-10-CM | POA: Insufficient documentation

## 2022-01-08 NOTE — Addendum Note (Signed)
Addended by: Audie Box on: 01/08/2022 03:10 PM   Modules accepted: Orders

## 2022-01-08 NOTE — Progress Notes (Signed)
Assessment: 1. 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 She has done very well without evidence of recurrence following a left partial nephrectomy 14 years ago. Renal ultrasound ordered Will call with results Return to office in 2 years  Chief Complaint:  Chief Complaint  Patient presents with   kidney cancer    History of Present Illness:  Karen Cannon is a 66 y.o. female who is seen for evaluation of renal cell carcinoma.  She was previously followed in [facility] and was last seen in October 2021. CMP from 9/23 showed a creatinine of 0.65 and normal LFTs. She is not having any new urinary symptoms.  No flank pain.  No dysuria or gross hematuria.  Her appetite is good.  No weight loss.  Urologic History: She is status post a left partial nephrectomy for T1a renal cell carcinoma in November 2009. She has done well since the procedure. No flank pain. No weight loss. Renal ultrasound from 5/11 showed mild prominence of the left renal pelvis. CT scan from 6/12 showed no evidence of recurrence or metastatic disease. Renal ultrasound from 1/14 showed no masses or hydronephrosis. CT scan from 8/14 showed no evidence of recurrent or metastatic disease. CMP from 1/15 was normal. Her chest x-ray from 04/26/14 showed no active disease. CMP from 2/17 showed a creatinine of 0.7 and normal liver function tests. CT from 2/17 showed no evidence of local recurrence or metastatic disease.  CMP from 2/18 shows Cr of 0.55 and normal LFT's.  CXR from 2/18 shows no acute changes.  She had a CT scan on 05/22/17 which showed no evidence of recurrence or metastatic disease. Chest x-ray from 2/19 showed no evidence of metastatic disease. Her creatinine was 0.65 and she had normal liver function test on 05/18/17. Renal ultrasound  from 01/07/2020 showed an indeterminate 1.3 cm hypoechoic possibly solid mass involving the left superior kidney which was apparently present on CT studies from 3/20 and 2/19. Chest x-ray from 9/21 showed no acute changes. CMP from 10/21 showed a creatinine of 0.68, normal LFTs. MRI from 10/21 showed postsurgical changes involving the left kidney compatible with partial nephrectomy, simple appearing cyst in the upper pole of the left kidney, no suspicious enhancing renal lesions identified.   Portions of the above documentation were copied from a prior visit for review purposes only.  Past Medical History:  Past Medical History:  Diagnosis Date   Anxiety    Cancer ([place])    GERD (gastroesophageal reflux disease)    High cholesterol    History of kidney cancer    Hypertension    Sleep apnea     Past Surgical History:  Past Surgical History:  Procedure Laterality Date   HERNIA REPAIR     kidney cancer surgery     REPLACEMENT TOTAL KNEE      Allergies:  Allergies  Allergen Reactions   Isovue [Iopamidol] Itching    Throat itching, tightness   Iodine    Simvastatin     cramps    Family History:  No family history on file.  Social History:  Social History   Tobacco Use   Smoking status: Never   Smokeless tobacco: Never  Substance Use Topics   Alcohol use: No    Alcohol/week: 0.0 standard drinks of alcohol   Drug use: No  Review of symptoms:  Constitutional:  Negative for unexplained weight loss, night sweats, fever, chills ENT:  Negative for nose bleeds, sinus pain, painful swallowing CV:  Negative for chest pain, shortness of breath, exercise intolerance, palpitations, loss of consciousness Resp:  Negative for cough, wheezing, shortness of breath GI:  Negative for nausea, vomiting, diarrhea, bloody stools GU:  Positives noted in HPI; otherwise negative for gross hematuria, dysuria, urinary incontinence Neuro:  Negative for seizures, poor balance, limb weakness,  slurred speech Psych:  Negative for lack of energy, depression, anxiety Endocrine:  Negative for polydipsia, polyuria, symptoms of hypoglycemia (dizziness, hunger, sweating) Hematologic:  Negative for anemia, purpura, petechia, prolonged or excessive bleeding, use of anticoagulants  Allergic:  Negative for difficulty breathing or choking as a result of exposure to anything; no shellfish allergy; no allergic response (rash/itch) to materials, foods  Physical exam: BP (!) 133/90   Pulse 94   Ht '5\' 3"'$  (1.6 m)   Wt 172 lb (78 kg)   BMI 30.47 kg/m  GENERAL APPEARANCE:  Well appearing, well developed, well nourished, NAD HEENT: Atraumatic, Normocephalic, oropharynx clear. NECK: Supple without lymphadenopathy or thyromegaly. LUNGS: Clear to auscultation bilaterally. HEART: Regular Rate and Rhythm without murmurs, gallops, or rubs. ABDOMEN: Soft, non-tender, No Masses. EXTREMITIES: Moves all extremities well.  Without clubbing, cyanosis, or edema. NEUROLOGIC:  Alert and oriented x 3, normal gait, CN II-XII grossly intact.  MENTAL STATUS:  Appropriate. BACK:  Non-tender to palpation.  No CVAT SKIN:  Warm, dry and intact.    Results: U/A: Dipstick negative

## 2022-01-09 LAB — URINALYSIS, ROUTINE W REFLEX MICROSCOPIC
Bilirubin, UA: NEGATIVE
Glucose, UA: NEGATIVE
Ketones, UA: NEGATIVE
Leukocytes,UA: NEGATIVE
Nitrite, UA: NEGATIVE
Protein,UA: NEGATIVE
RBC, UA: NEGATIVE
Specific Gravity, UA: 1.015 (ref 1.005–1.030)
Urobilinogen, Ur: 0.2 mg/dL (ref 0.2–1.0)
pH, UA: 8.5 — ABNORMAL HIGH (ref 5.0–7.5)

## 2022-01-16 ENCOUNTER — Encounter (INDEPENDENT_AMBULATORY_CARE_PROVIDER_SITE_OTHER): Payer: Self-pay | Admitting: Family Medicine

## 2022-01-16 ENCOUNTER — Ambulatory Visit (INDEPENDENT_AMBULATORY_CARE_PROVIDER_SITE_OTHER): Payer: Medicare HMO | Admitting: Family Medicine

## 2022-01-16 VITALS — BP 117/79 | HR 68 | Temp 97.6°F | Ht 63.0 in | Wt 173.0 lb

## 2022-01-16 DIAGNOSIS — E669 Obesity, unspecified: Secondary | ICD-10-CM | POA: Diagnosis not present

## 2022-01-16 DIAGNOSIS — Z0289 Encounter for other administrative examinations: Secondary | ICD-10-CM

## 2022-01-16 DIAGNOSIS — Z789 Other specified health status: Secondary | ICD-10-CM

## 2022-01-16 DIAGNOSIS — I10 Essential (primary) hypertension: Secondary | ICD-10-CM

## 2022-01-16 DIAGNOSIS — M810 Age-related osteoporosis without current pathological fracture: Secondary | ICD-10-CM | POA: Insufficient documentation

## 2022-01-16 DIAGNOSIS — Z6830 Body mass index (BMI) 30.0-30.9, adult: Secondary | ICD-10-CM

## 2022-01-16 DIAGNOSIS — M818 Other osteoporosis without current pathological fracture: Secondary | ICD-10-CM

## 2022-01-17 ENCOUNTER — Ambulatory Visit (HOSPITAL_BASED_OUTPATIENT_CLINIC_OR_DEPARTMENT_OTHER)
Admission: RE | Admit: 2022-01-17 | Discharge: 2022-01-17 | Disposition: A | Discharge status: Home / Self Care | Payer: Medicare HMO | Source: Ambulatory Visit | Attending: Urology | Admitting: Urology

## 2022-01-17 DIAGNOSIS — C642 Malignant neoplasm of left kidney, except renal pelvis: Secondary | ICD-10-CM | POA: Insufficient documentation

## 2022-01-18 NOTE — Progress Notes (Signed)
    Chief Complaint:   OBESITY Today's weight: 173 Today's date: 01/16/22  Interim History: Patient started vegan diet 6 years ago.  Lost down from 185 pounds to 165 pounds using weight watchers but has regained 5 pounds this year.  Reports snacking on dark chocolate and peanut butter pretzels and no regular exercise.  Finds it harder to lose weight post menopause.  Subjective:   1. Essential hypertension Blood pressure well controlled on Diovan 80 mg daily.  2. Other osteoporosis, unspecified pathological fracture presence DEXA scan not available for review. Takes calcium carbonate and vitamin D over-the-counter daily.  3. Vegan diet Patient does take a multivitamin daily and oral iron (Long history of iron deficiency anemia). Struggles to get in enough protein.   Assessment/Plan:   1. Essential hypertension Continue current meds per PCP. Look for blood pressure improvements with weight loss.  2. Other osteoporosis, unspecified pathological fracture presence Discussed importance of weightbearing exercise. Plan to get vitamin D level with next labs.  3. Vegan diet Continue over-the-counter multivitamin daily.  Will discuss specific dietary changes next visit.  4. Obesity, current BMI 30.6 1.  Reviewed information about obesity in our program. 2.  Reviewed bioimpedance results with patient.  Kaysie has decided to move forward with joining our program.   We discussed obesity as a disease and her current bioimpedence results.  Karen Cannon has agreed to follow-up with our clinic in 2 weeks. She was informed of the importance of frequent follow-up visits to maximize her success with intensive lifestyle modifications for her multiple health conditions.   Objective:   Blood pressure 117/79, pulse 68, temperature 97.6 F (36.4 C), height '5\' 3"'$  (1.6 m), weight 173 lb (78.5 kg), SpO2 95 %. Body mass index is 30.65 kg/m.  General: Cooperative, alert, well developed, in no acute  distress. HEENT: Conjunctivae and lids unremarkable. Cardiovascular: Regular rhythm.  Lungs: Normal work of breathing. Neurologic: No focal deficits.   Lab Results  Component Value Date   CREATININE 0.49 08/27/2015   BUN 9 08/27/2015   NA 141 08/27/2015   K 3.4 (L) 08/27/2015   CL 106 08/27/2015   CO2 27 08/27/2015   Lab Results  Component Value Date   ALT 17 08/27/2015   AST 17 08/27/2015   ALKPHOS 65 08/27/2015   BILITOT 0.5 08/27/2015   No results found for: "HGBA1C" No results found for: "INSULIN" No results found for: "TSH" No results found for: "CHOL", "HDL", "LDLCALC", "LDLDIRECT", "TRIG", "CHOLHDL" No results found for: "VD25OH" Lab Results  Component Value Date   WBC 5.8 08/27/2015   HGB 12.8 08/27/2015   HCT 38.3 08/27/2015   MCV 97.2 08/27/2015   PLT 283 08/27/2015   No results found for: "IRON", "TIBC", "FERRITIN"   Attestation Statements:   Reviewed by clinician on day of visit: allergies, medications, problem list, medical history, surgical history, family history, social history, and previous encounter notes.  I, Georgianne Fick, FNP, am acting as transcriptionist for Dr. Loyal Gambler.   I have reviewed the above documentation for accuracy and completeness, and I agree with the above. Dell Ponto, DO

## 2022-01-19 ENCOUNTER — Encounter: Payer: Self-pay | Admitting: Urology

## 2022-01-26 ENCOUNTER — Telehealth: Payer: Self-pay | Admitting: Neurology

## 2022-01-26 NOTE — Telephone Encounter (Signed)
The brain MRI from 06/15/2021 was personally reviewed.  Is essentially normal for age.  No age advanced atrophy.  About 6 punctate T2/FLAIR hyperintense foci in the subcortical white matter consistent with very minimal chronic microvascular ischemic change that is typical for age.  Normal enhancement pattern.

## 2022-01-31 ENCOUNTER — Ambulatory Visit (INDEPENDENT_AMBULATORY_CARE_PROVIDER_SITE_OTHER): Payer: Medicare HMO | Admitting: Family Medicine

## 2022-02-04 ENCOUNTER — Other Ambulatory Visit: Payer: Self-pay | Admitting: Neurology

## 2022-02-14 ENCOUNTER — Ambulatory Visit (INDEPENDENT_AMBULATORY_CARE_PROVIDER_SITE_OTHER): Payer: Medicare HMO | Admitting: Family Medicine

## 2022-03-06 ENCOUNTER — Encounter (INDEPENDENT_AMBULATORY_CARE_PROVIDER_SITE_OTHER): Payer: Self-pay | Admitting: Family Medicine

## 2022-03-06 ENCOUNTER — Ambulatory Visit (INDEPENDENT_AMBULATORY_CARE_PROVIDER_SITE_OTHER): Payer: Medicare HMO | Admitting: Family Medicine

## 2022-03-06 VITALS — BP 128/85 | HR 63 | Temp 97.7°F | Ht 63.0 in | Wt 174.0 lb

## 2022-03-06 DIAGNOSIS — Z1331 Encounter for screening for depression: Secondary | ICD-10-CM

## 2022-03-06 DIAGNOSIS — G4733 Obstructive sleep apnea (adult) (pediatric): Secondary | ICD-10-CM

## 2022-03-06 DIAGNOSIS — R5383 Other fatigue: Secondary | ICD-10-CM

## 2022-03-06 DIAGNOSIS — I10 Essential (primary) hypertension: Secondary | ICD-10-CM

## 2022-03-06 DIAGNOSIS — R0602 Shortness of breath: Secondary | ICD-10-CM | POA: Diagnosis not present

## 2022-03-06 DIAGNOSIS — Z6830 Body mass index (BMI) 30.0-30.9, adult: Secondary | ICD-10-CM

## 2022-03-06 DIAGNOSIS — E559 Vitamin D deficiency, unspecified: Secondary | ICD-10-CM

## 2022-03-06 DIAGNOSIS — Z789 Other specified health status: Secondary | ICD-10-CM

## 2022-03-06 DIAGNOSIS — E669 Obesity, unspecified: Secondary | ICD-10-CM

## 2022-03-06 MED ORDER — VITAMIN D (ERGOCALCIFEROL) 1.25 MG (50000 UNIT) PO CAPS: 50000.0000 [IU] | ORAL_CAPSULE | ORAL | 0 refills | Status: AC

## 2022-03-07 LAB — CBC WITH DIFFERENTIAL/PLATELET
Basophils Absolute: 0 10*3/uL (ref 0.0–0.2)
Basos: 1 %
EOS (ABSOLUTE): 0.1 10*3/uL (ref 0.0–0.4)
Eos: 2 %
Hematocrit: 40.2 % (ref 34.0–46.6)
Hemoglobin: 13.8 g/dL (ref 11.1–15.9)
Immature Grans (Abs): 0 10*3/uL (ref 0.0–0.1)
Immature Granulocytes: 0 %
Lymphocytes Absolute: 1.4 10*3/uL (ref 0.7–3.1)
Lymphs: 25 %
MCH: 33.3 pg — ABNORMAL HIGH (ref 26.6–33.0)
MCHC: 34.3 g/dL (ref 31.5–35.7)
MCV: 97 fL (ref 79–97)
Monocytes Absolute: 0.5 10*3/uL (ref 0.1–0.9)
Monocytes: 10 %
Neutrophils Absolute: 3.4 10*3/uL (ref 1.4–7.0)
Neutrophils: 62 %
Platelets: 264 10*3/uL (ref 150–450)
RBC: 4.14 x10E6/uL (ref 3.77–5.28)
RDW: 12.1 % (ref 11.7–15.4)
WBC: 5.4 10*3/uL (ref 3.4–10.8)

## 2022-03-07 LAB — INSULIN, RANDOM: INSULIN: 9.2 u[IU]/mL (ref 2.6–24.9)

## 2022-03-07 LAB — HEMOGLOBIN A1C
Est. average glucose Bld gHb Est-mCnc: 117 mg/dL
Hgb A1c MFr Bld: 5.7 % — ABNORMAL HIGH (ref 4.8–5.6)

## 2022-03-15 NOTE — Progress Notes (Signed)
.  Chief Complaint:   OBESITY Karen Cannon (MR# [id number]) is a 66 y.o. female who presents for evaluation and treatment of obesity and related comorbidities. Current BMI is Body mass index is 30.82 kg/m. Karen Cannon has been struggling with her weight for many years and has been unsuccessful in either losing weight, maintaining weight loss, or reaching her healthy weight goal.  Karen Cannon is retired and lives with her husband, she has good support at home.  She would like to lose 15 pounds over the next 6 months.  She loves chocolate.  She gained weight after having children.  She has adopted a vegan diet.  She lacks motivation to add in more walking and dislikes hills in her neighborhood.  She also snacks in the afternoon.  She consumes natural peanut butter with several meals.  Karen Cannon is currently in the action stage of change and ready to dedicate time achieving and maintaining a healthier weight. Karen Cannon is interested in becoming our patient and working on intensive lifestyle modifications including (but not limited to) diet and exercise for weight loss.  Karen Cannon's habits were reviewed today and are as follows: Her family eats meals together, her desired weight loss is 14 lbs, she has been heavy most of her life, she started gaining weight after having her children, her heaviest weight ever was 193 pounds, she has significant food cravings issues, she is trying to follow a vegan diet, she frequently eats larger portions than normal, and she struggles with emotional eating.  Depression Screen Karen Cannon's Food and Mood (modified PHQ-9) score was 3.  Subjective:   1. Other fatigue Karen Cannon denies daytime somnolence and denies waking up still tired. Patient has a history of symptoms of Karen Cannon. Karen Cannon generally gets 7 hours of sleep per night, and states that she has generally restful sleep. Snoring is not present. Apneic episodes are not present. Epworth Sleepiness Score is 0.  She gets 6.7 hours  of sleep.  We reviewed labs from 12/14/2021.  WBC's are low at 3.7.  EKG: NSR Without ischemia.   2. SOBOE (shortness of breath on exertion) Karen Cannon notes increasing shortness of breath with exercising and seems to be worsening over time with weight gain. She notes getting out of breath sooner with activity than she used to. This has not gotten worse recently. Karen Cannon denies shortness of breath at rest or orthopnea.  3. OSA treated with BiPAP Patient gets 6.7 hours of sleep per night with BiPAP.  4. Vitamin D deficiency Vitamin D level on 12/14/2021 was 37.  She is on OTC vitamin D 1000 IU daily.  5. Essential hypertension Her blood pressure is well-controlled on valsartan 80 mg daily.  She denies any adverse side effects.  6. Vegan diet She is on a vegan diet.  She takes several vitamins.  She eats tofu, beans, broccoli, peas, nutritional yeast.  Assessment/Plan:   1. Other fatigue Karen Cannon does feel that her weight is causing her energy to be lower than it should be. Fatigue may be related to obesity, depression or many other causes. Labs will be ordered, and in the meanwhile, Karen Cannon will focus on self care including making healthy food choices, increasing physical activity and focusing on stress reduction.  Update labs today  - EKG 12-Lead - Insulin, random - Hemoglobin A1c - CBC with Differential/Platelet  2. SOBOE (shortness of breath on exertion) Karen Cannon does feel that she gets out of breath more easily that she used to when she exercises. Karen Cannon's shortness of breath  appears to be obesity related and exercise induced. She has agreed to work on weight loss and gradually increase exercise to treat her exercise induced shortness of breath. Will continue to monitor closely.  3. OSA treated with BiPAP Look for improvement in OSA with weight loss.  4. Vitamin D deficiency Discontinue OTC vitamin D.  Recheck labs in 3 to 4 months.  Begin- Vitamin D, Ergocalciferol, (DRISDOL) 1.25 MG  (50000 UNIT) CAPS capsule; Take 1 capsule (50,000 Units total) by mouth every 7 (seven) days.  Dispense: 5 capsule; Refill: 0  5. Essential hypertension Continue valsartan 80 mg daily.  6. Vegan diet Continue vitamin, can replace with a daily multivitamin.  We reviewed vegan sources of protein.  Karen Cannon  7. Depression screen Karen Cannon had a positive depression screening. Depression is commonly associated with obesity and often results in emotional eating behaviors. We will monitor this closely and work on CBT to help improve the non-hunger eating patterns. Referral to Psychology may be required if no improvement is seen as she continues in our clinic.   8. Obesity,current BMI 30.9 1) download the lose it app to log intake on vegan diet. 2) recommend OWYN shakes, silk, plain yogurt, silk high-protein milk.  Karen Cannon is currently in the action stage of change and her goal is to continue with weight loss efforts. I recommend Karen Cannon begin the structured treatment plan as follows:  She has agreed to keeping a food journal and adhering to recommended goals of 1300 calories and 75 protein daily. Vegan diet.   Exercise goals: Plans to walk with a goal of 30 minutes 5 times a week.  Behavioral modification strategies: increasing lean protein intake, increasing vegetables, increasing water intake, decreasing liquid calories, no skipping meals, meal planning and cooking strategies, keeping healthy foods in the home, better snacking choices, and decreasing junk food.  She was informed of the importance of frequent follow-up visits to maximize her success with intensive lifestyle modifications for her multiple health conditions. She was informed we would discuss her lab results at her next visit unless there is a critical issue that needs to be addressed sooner. Karen Cannon agreed to keep her next visit at the agreed upon time to discuss these results.  Objective:   Blood pressure 128/85, pulse 63,  temperature 97.7 F (36.5 C), height '5\' 3"'$  (1.6 m), weight 174 lb (78.9 kg), SpO2 96 %. Body mass index is 30.82 kg/m.  EKG: Normal sinus rhythm, rate 69 BPM.  Indirect Calorimeter completed today shows a VO2 of 216 and a REE of 1483.  Her calculated basal metabolic rate is 5573 thus her basal metabolic rate is better than expected.  General: Cooperative, alert, well developed, in no acute distress. HEENT: Conjunctivae and lids unremarkable. Cardiovascular: Regular rhythm.  Lungs: Normal work of breathing. Neurologic: No focal deficits.   Lab Results  Component Value Date   CREATININE 0.49 08/27/2015   BUN 9 08/27/2015   NA 141 08/27/2015   K 3.4 (L) 08/27/2015   CL 106 08/27/2015   CO2 27 08/27/2015   Lab Results  Component Value Date   ALT 17 08/27/2015   AST 17 08/27/2015   ALKPHOS 65 08/27/2015   BILITOT 0.5 08/27/2015   Lab Results  Component Value Date   HGBA1C 5.7 (H) 03/06/2022   Lab Results  Component Value Date   INSULIN 9.2 03/06/2022   No results found for: "TSH" No results found for: "CHOL", "HDL", "LDLCALC", "LDLDIRECT", "TRIG", "CHOLHDL" Lab Results  Component Value  Date   WBC 5.4 03/06/2022   HGB 13.8 03/06/2022   HCT 40.2 03/06/2022   MCV 97 03/06/2022   PLT 264 03/06/2022   No results found for: "IRON", "TIBC", "FERRITIN"  Attestation Statements:   Reviewed by clinician on day of visit: allergies, medications, problem list, medical history, surgical history, family history, social history, and previous encounter notes.  I have personally spent 45 minutes total time today in preparation, patient care, and documentation for this visit, including the following: review of clinical lab tests; review of medical tests/procedures/services.    I, Davy Pique, am acting as Location manager for Loyal Gambler, DO.  I have reviewed the above documentation for accuracy and completeness, and I agree with the above. Dell Ponto, DO

## 2022-03-16 ENCOUNTER — Other Ambulatory Visit: Payer: Self-pay | Admitting: Neurology

## 2022-03-20 ENCOUNTER — Ambulatory Visit (INDEPENDENT_AMBULATORY_CARE_PROVIDER_SITE_OTHER): Payer: Medicare HMO | Admitting: Family Medicine

## 2022-03-20 ENCOUNTER — Encounter (INDEPENDENT_AMBULATORY_CARE_PROVIDER_SITE_OTHER): Payer: Self-pay | Admitting: Family Medicine

## 2022-03-20 VITALS — BP 127/84 | HR 81 | Temp 98.4°F | Ht 63.0 in | Wt 174.0 lb

## 2022-03-20 DIAGNOSIS — Z789 Other specified health status: Secondary | ICD-10-CM

## 2022-03-20 DIAGNOSIS — E559 Vitamin D deficiency, unspecified: Secondary | ICD-10-CM | POA: Diagnosis not present

## 2022-03-20 DIAGNOSIS — R7303 Prediabetes: Secondary | ICD-10-CM

## 2022-03-20 DIAGNOSIS — Z6830 Body mass index (BMI) 30.0-30.9, adult: Secondary | ICD-10-CM

## 2022-03-20 DIAGNOSIS — E669 Obesity, unspecified: Secondary | ICD-10-CM

## 2022-03-22 ENCOUNTER — Other Ambulatory Visit: Payer: Self-pay | Admitting: Neurology

## 2022-03-28 NOTE — Progress Notes (Signed)
Chief Complaint:   OBESITY Karen Cannon is here to discuss her progress with her obesity treatment plan along with follow-up of her obesity related diagnoses. Karen Cannon is on keeping a food journal and adhering to recommended goals of 1300 calories and 75 protein and states she is following her eating plan approximately 50% of the time. Karen Cannon states she is walking 20-30 minutes 4-5 times per week.  Today's visit was #: 2 Starting weight: 174 LBS Starting date: 03/06/2022 Today's weight: 174 LBS Today's date: 03/20/2022 Total lbs lost to date: 0 Total lbs lost since last in-office visit: 0  Interim History: Patient guarding overall calorie intake from day.  Financial strain has played a part.  She will be traveling to [place] to see and granddaughter.  She started walking since last visit.  She is struggling to hit protein goal.  She has not tried vegan yogurts for protein powders yet.  She tends to over snack between 3 to 5 PM.  Subjective:   1. Vitamin D deficiency Patient started prescription vitamin D 50,000 IU weekly.  Vitamin D level on 12/14/2021 was 37.  2. Prediabetes New diagnosis.  Discussed labs with patient today. Last A1c was 5.7 on 03/06/2022.  Patient has cut back on peanut butter and chocolate intake and has added in more walking.  3. Vegan diet Patient is taking a multivitamin daily.  Consumes 2-3 veggies servings, +2 fruit servings per day, but lacks adequate protein intake.  Assessment/Plan:   1. Vitamin D deficiency Continue with prescription vitamin D.  Recheck level in 3 to 4 months.  2. Prediabetes Continue to reduce intake of added sugars and increase walking prior to 30 minutes 4 times per week.  3. Vegan diet Add in 20 to 30 g pea protein powder 1-2 times daily to reach 75 g protein target daily.  4. Obesity,current BMI 30.9 Muscle mass increased  0.6 LBS, fat mass decreased 0.4 LBS  Karen Cannon is currently in the action stage of change. As such, her  goal is to continue with weight loss efforts. She has agreed to keeping a food journal and adhering to recommended goals of 1300 calories and 75 protein. She is on a vegan diet.   Exercise goals:  As is.  Behavioral modification strategies: increasing lean protein intake, increasing vegetables, increasing water intake, increasing high fiber foods, decreasing eating out, no skipping meals, keeping healthy foods in the home, better snacking choices, and travel eating strategies.  Karen Cannon has agreed to follow-up with our clinic in 3 weeks. She was informed of the importance of frequent follow-up visits to maximize her success with intensive lifestyle modifications for her multiple health conditions.   Objective:   Blood pressure 127/84, pulse 81, temperature 98.4 F (36.9 C), height '5\' 3"'$  (1.6 m), weight 174 lb (78.9 kg), SpO2 97 %. Body mass index is 30.82 kg/m.  General: Cooperative, alert, well developed, in no acute distress. HEENT: Conjunctivae and lids unremarkable. Cardiovascular: Regular rhythm.  Lungs: Normal work of breathing. Neurologic: No focal deficits.   Lab Results  Component Value Date   CREATININE 0.49 08/27/2015   BUN 9 08/27/2015   NA 141 08/27/2015   K 3.4 (L) 08/27/2015   CL 106 08/27/2015   CO2 27 08/27/2015   Lab Results  Component Value Date   ALT 17 08/27/2015   AST 17 08/27/2015   ALKPHOS 65 08/27/2015   BILITOT 0.5 08/27/2015   Lab Results  Component Value Date   HGBA1C 5.7 (H)  03/06/2022   Lab Results  Component Value Date   INSULIN 9.2 03/06/2022   No results found for: "TSH" No results found for: "CHOL", "HDL", "LDLCALC", "LDLDIRECT", "TRIG", "CHOLHDL" No results found for: "VD25OH" Lab Results  Component Value Date   WBC 5.4 03/06/2022   HGB 13.8 03/06/2022   HCT 40.2 03/06/2022   MCV 97 03/06/2022   PLT 264 03/06/2022   No results found for: "IRON", "TIBC", "FERRITIN"  Attestation Statements:   Reviewed by clinician on day of  visit: allergies, medications, problem list, medical history, surgical history, family history, social history, and previous encounter notes.  I, Davy Pique, am acting as Location manager for Loyal Gambler, DO.  I have reviewed the above documentation for accuracy and completeness, and I agree with the above. Dell Ponto, DO

## 2022-04-17 ENCOUNTER — Ambulatory Visit (INDEPENDENT_AMBULATORY_CARE_PROVIDER_SITE_OTHER): Payer: Medicare HMO | Admitting: Adult Health

## 2022-04-17 ENCOUNTER — Encounter (INDEPENDENT_AMBULATORY_CARE_PROVIDER_SITE_OTHER): Payer: Self-pay | Admitting: Adult Health

## 2022-04-17 VITALS — BP 115/78 | Temp 97.9°F | Ht 63.0 in | Wt 174.0 lb

## 2022-04-17 DIAGNOSIS — E669 Obesity, unspecified: Secondary | ICD-10-CM

## 2022-04-17 DIAGNOSIS — G4733 Obstructive sleep apnea (adult) (pediatric): Secondary | ICD-10-CM

## 2022-04-17 DIAGNOSIS — Z6831 Body mass index (BMI) 31.0-31.9, adult: Secondary | ICD-10-CM | POA: Diagnosis not present

## 2022-04-17 DIAGNOSIS — Z6830 Body mass index (BMI) 30.0-30.9, adult: Secondary | ICD-10-CM

## 2022-04-23 NOTE — Progress Notes (Signed)
Chief Complaint:   OBESITY Karen Cannon is here to discuss her progress with her obesity treatment plan along with follow-up of her obesity related diagnoses. Karen Cannon is on keeping a food journal and adhering to recommended goals of 1300 calories and 75 protein and states she is following her eating plan approximately 50% of the time. Karen Cannon states she is walking 20 minutes 2 times per week.  Today's visit was #: 3 Starting weight: 174 LBS Starting date: 03/06/2022 Today's weight: 174 LBS Today's date: 04/17/2022 0 Total lbs lost to date: 0 Total lbs lost since last in-office visit: 0  Interim History:  Karen Cannon shared: She was a vegan for 6 years, practicing vegetarian in last 3 years. She rarely uses any "meat substitute" products.  She provided the following food recall that is typical of a day: Breakfast: Waffles with peanut butter Lunch:Homemade soup with vegan crackers. Dinner: Vegetarian casserole (quino, rice, beans) Snacks: Pumpkin seeds, Apple/Fruit  Subjective:   1. OSA (obstructive sleep apnea) Patient is getting deep sleep at least 10%, 12 minutes in the last 5 days- per CPAP data. She is 100% compliant with her CPAP. She stopped taking trazodone 50 mg Nightly.    Assessment/Plan:   1. OSA (obstructive sleep apnea) Continue nightly CPAP. Continue with weight loss efforts.  2. Obesity,  current BMI 31.0 Karen Cannon is currently in the action stage of change. As such, her goal is to continue with weight loss efforts. She has agreed to keeping a food journal and adhering to recommended goals of 1300 calories and 75 protein.   Exercise goals:  As is.  Behavioral modification strategies: increasing lean protein intake, decreasing simple carbohydrates, meal planning and cooking strategies, keeping healthy foods in the home, and planning for success.  Karen Cannon has agreed to follow-up with our clinic in 2 weeks. She was informed of the importance of frequent follow-up  visits to maximize her success with intensive lifestyle modifications for her multiple health conditions.   Objective:   Blood pressure 115/78, temperature 97.9 F (36.6 C), height '5\' 3"'$  (1.6 m), weight 174 lb (78.9 kg). Body mass index is 30.82 kg/m.  General: Cooperative, alert, well developed, in no acute distress. HEENT: Conjunctivae and lids unremarkable. Cardiovascular: Regular rhythm.  Lungs: Normal work of breathing. Neurologic: No focal deficits.   Lab Results  Component Value Date   CREATININE 0.49 08/27/2015   BUN 9 08/27/2015   NA 141 08/27/2015   K 3.4 (L) 08/27/2015   CL 106 08/27/2015   CO2 27 08/27/2015   Lab Results  Component Value Date   ALT 17 08/27/2015   AST 17 08/27/2015   ALKPHOS 65 08/27/2015   BILITOT 0.5 08/27/2015   Lab Results  Component Value Date   HGBA1C 5.7 (H) 03/06/2022   Lab Results  Component Value Date   INSULIN 9.2 03/06/2022   No results found for: "TSH" No results found for: "CHOL", "HDL", "LDLCALC", "LDLDIRECT", "TRIG", "CHOLHDL" No results found for: "VD25OH" Lab Results  Component Value Date   WBC 5.4 03/06/2022   HGB 13.8 03/06/2022   HCT 40.2 03/06/2022   MCV 97 03/06/2022   PLT 264 03/06/2022   No results found for: "IRON", "TIBC", "FERRITIN"  Attestation Statements:   Reviewed by clinician on day of visit: allergies, medications, problem list, medical history, surgical history, family history, social history, and previous encounter notes.  I, Davy Pique, RMA, am acting as Location manager for Mina Marble, NP.  I have reviewed the above  documentation for accuracy and completeness, and I agree with the above. -  Sylis Ketchum d. Aubriana Ravelo, NP-C

## 2022-05-08 ENCOUNTER — Ambulatory Visit (INDEPENDENT_AMBULATORY_CARE_PROVIDER_SITE_OTHER): Payer: Medicare HMO | Admitting: Adult Health

## 2022-08-16 ENCOUNTER — Other Ambulatory Visit: Payer: Self-pay

## 2022-08-16 ENCOUNTER — Encounter (HOSPITAL_BASED_OUTPATIENT_CLINIC_OR_DEPARTMENT_OTHER): Payer: Self-pay | Admitting: Emergency Medicine

## 2022-08-16 ENCOUNTER — Emergency Department (HOSPITAL_BASED_OUTPATIENT_CLINIC_OR_DEPARTMENT_OTHER)
Admission: EM | Admit: 2022-08-16 | Discharge: 2022-08-17 | Disposition: A | Discharge status: Home / Self Care | Payer: Medicare HMO | Attending: Emergency Medicine | Admitting: Emergency Medicine

## 2022-08-16 DIAGNOSIS — M545 Low back pain, unspecified: Secondary | ICD-10-CM | POA: Diagnosis present

## 2022-08-16 DIAGNOSIS — M461 Sacroiliitis, not elsewhere classified: Secondary | ICD-10-CM | POA: Diagnosis not present

## 2022-08-16 MED ORDER — HYDROCODONE-ACETAMINOPHEN 5-325 MG PO TABS
1.0000 | ORAL_TABLET | Freq: Once | ORAL | Status: AC
  Administered 2022-08-16: 1 via ORAL
  Filled 2022-08-16: qty 1

## 2022-08-16 MED ORDER — KETOROLAC TROMETHAMINE 30 MG/ML IJ SOLN
30.0000 mg | Freq: Once | INTRAMUSCULAR | Status: AC
  Administered 2022-08-16: 30 mg via INTRAMUSCULAR
  Filled 2022-08-16: qty 1

## 2022-08-16 NOTE — ED Provider Notes (Signed)
[place] [facility]  Provider Note  CSN: [id number] Arrival date & time: 08/16/22 2216  History Chief Complaint  Patient presents with   Back Pain    Karen Cannon is a 67 y.o. female with history of thoracic back pain reports 2 days of R lower back pain, radiating to left side. Started when she was getting up from recliner last night. No falls or injuries. Pain does not radiated into her leg. She denies any urinary symptoms or incontinence. No radiation to lower abdomen. She has tried taking ibuprofen 400mg , Robaxin, tramadol, heat and salonpas patch without improvement.    Home Medications Prior to Admission medications   Medication Sig Start Date End Date Taking? Authorizing Provider  HYDROcodone-acetaminophen (NORCO/VICODIN) 5-325 MG tablet Take 1 tablet by mouth every 6 (six) hours as needed for severe pain. 08/17/22  Yes Pollyann Savoy, MD  azelastine (ASTELIN) 0.1 % nasal spray Place into the nose. 09/26/21   [provider]  calcium carbonate (OS-CAL) 1250 (500 Ca) MG chewable tablet Chew by mouth.    [provider]  escitalopram (LEXAPRO) 10 MG tablet Take 10 mg by mouth daily. 09/15/21   [provider]  estradiol (ESTRACE) 0.1 MG/GM vaginal cream Insert 1 applicatorful twice a week by vaginal route.    [provider]  famotidine (PEPCID) 10 MG tablet Take by mouth.    [provider]  imipramine (TOFRANIL) 25 MG tablet Take 25 mg by mouth at bedtime. 03/04/22   [provider]  ipratropium (ATROVENT) 0.06 % nasal spray Place into the nose. Patient not taking: Reported on 04/17/2022 09/26/21   [provider]  meloxicam (MOBIC) 15 MG tablet Take 1 tablet (15 mg total) by mouth daily for 14 days. 08/17/22 08/31/22  Pollyann Savoy, MD  ondansetron (ZOFRAN) 4 MG tablet Take 4 mg by mouth every 6 (six) hours as needed. 12/17/18   [provider]  ondansetron (ZOFRAN) 8 MG  tablet Take 4-8 mg by mouth every 8 (eight) hours as needed. 08/26/21   [provider]  pantoprazole (PROTONIX) 20 MG tablet Take 20 mg by mouth daily. 09/08/21   [provider]  Pravastatin Sodium (PRAVACHOL PO) Take 20 mg by mouth daily.     [provider]  traMADol (ULTRAM) 50 MG tablet Take 50 mg by mouth every 6 (six) hours as needed. 11/20/21   [provider]  traZODone (DESYREL) 50 MG tablet Take 0.5 tablets (25 mg total) by mouth at bedtime. Patient not taking: Reported on 04/17/2022 12/12/21   Asa Lente, MD  valsartan (DIOVAN) 80 MG tablet Take 80 mg by mouth daily. 09/15/21   [provider]  Vitamin D, Ergocalciferol, (DRISDOL) 1.25 MG (50000 UNIT) CAPS capsule Take 1 capsule (50,000 Units total) by mouth every 7 (seven) days. 03/06/22   Bowen, Scot Jun, DO     Allergies    Isovue [iopamidol], Iodine, and Simvastatin   Review of Systems   Review of Systems Please see HPI for pertinent positives and negatives  Physical Exam BP (!) 156/108 (BP Location: Left Arm)   Pulse 97   Temp 97.8 F (36.6 C) (Oral)   Resp (!) 24   Ht 5\' 3"  (1.6 m)   Wt 78.9 kg   SpO2 94%   BMI 30.82 kg/m   Physical Exam Vitals and nursing note reviewed.  Constitutional:      Appearance: Normal appearance.  HENT:  Head: Normocephalic and atraumatic.     Nose: Nose normal.     Mouth/Throat:     Mouth: Mucous membranes are moist.  Eyes:     Extraocular Movements: Extraocular movements intact.     Conjunctiva/sclera: Conjunctivae normal.  Cardiovascular:     Rate and Rhythm: Normal rate.  Pulmonary:     Effort: Pulmonary effort is normal.     Breath sounds: Normal breath sounds.  Abdominal:     General: Abdomen is flat.     Palpations: Abdomen is soft.     Tenderness: There is no abdominal tenderness.  Musculoskeletal:        General: Tenderness (most tender over the R SI joint, some surrounding soft tissue/muscle tenderness in lumbar  paraspinal region, no midline tenderness, no sciatic notch tenderness) present. No swelling. Normal range of motion.     Cervical back: Neck supple.  Skin:    General: Skin is warm and dry.  Neurological:     General: No focal deficit present.     Mental Status: She is alert.  Psychiatric:        Mood and Affect: Mood normal.     ED Results / Procedures / Treatments   EKG None  Procedures Procedures  Medications Ordered in the ED Medications  ketorolac (TORADOL) 30 MG/ML injection 30 mg (30 mg Intramuscular Given 08/16/22 2347)  HYDROcodone-acetaminophen (NORCO/VICODIN) 5-325 MG per tablet 1 tablet (1 tablet Oral Given 08/16/22 2344)    Initial Impression and Plan  Patient here for low back pain, does not sound like renal colic or UTI. No red flags for acute surgical process. Likely sacroiliitis by exam. Will give a dose of Toradol and PO Norco and reassess.   ED Course   Clinical Course as of 08/17/22 0021  Caleen Essex Aug 17, 2022  0019 Patient reports some improvement in pain. Plan discharge with refill of Mobic, Rx for Norco. Follow up with her Pain doctor for further management.  [CS]    Clinical Course User Index [CS] Pollyann Savoy, MD     MDM Rules/Calculators/A&P Medical Decision Making Problems Addressed: Sacroiliitis [facility]): acute illness or injury  Risk Prescription drug management.     Final Clinical Impression(s) / ED Diagnoses Final diagnoses:  Sacroiliitis (HCC)    Rx / DC Orders ED Discharge Orders          Ordered    meloxicam (MOBIC) 15 MG tablet  Daily        08/17/22 0021    HYDROcodone-acetaminophen (NORCO/VICODIN) 5-325 MG tablet  Every 6 hours PRN        08/17/22 0021             Pollyann Savoy, MD 08/17/22 0021

## 2022-08-16 NOTE — ED Triage Notes (Signed)
Patient arrived via POV c/o lower right back pain x 1 day. Patient states it starting last night, getting worse this evening. Patient states it feels like it's spreading towards middle of back. Patient tearful in triage. Patient is AO x 4, VS w/ elevated BP, normal gait.

## 2022-08-17 MED ORDER — MELOXICAM 15 MG PO TABS: 15.0000 mg | ORAL_TABLET | Freq: Every day | ORAL | 0 refills | Status: AC

## 2022-08-17 MED ORDER — HYDROCODONE-ACETAMINOPHEN 5-325 MG PO TABS: 1.0000 | ORAL_TABLET | Freq: Four times a day (QID) | ORAL | 0 refills | Status: AC | PRN

## 2022-08-22 ENCOUNTER — Telehealth: Payer: Self-pay | Admitting: Neurology

## 2022-08-22 NOTE — Telephone Encounter (Signed)
I called patient regarding the below. I explained she was to have a follow up visit in Feb 2024 that would be 6 month follow and she would have an order at that visit for new supplies. Pt said she will call the office to schedule visit.

## 2022-08-22 NOTE — Telephone Encounter (Signed)
Pt stated she received a call from [facility], stated they said something about needing a prescription. Pt stated nothing has changed so I don't know what they need, Pt requesting a call back from nurse.

## 2022-10-15 ENCOUNTER — Encounter: Payer: Self-pay | Admitting: *Deleted

## 2022-10-15 NOTE — Progress Notes (Unsigned)
PATIENT: Karen Cannon DOB: 1955/10/13  REASON FOR VISIT: follow up HISTORY FROM: patient  Virtual Visit via Telephone Note  I connected with Karen Cannon on 10/16/22 at  9:15 AM EDT by telephone and verified that I am speaking with the correct person using two identifiers.   I discussed the limitations, risks, security and privacy concerns of performing an evaluation and management service by telephone and the availability of in person appointments. I also discussed with the patient that there may be a patient responsible charge related to this service. The patient expressed understanding and agreed to proceed.   History of Present Illness:  10/16/22 ALL (Mychart): Karen Cannon is a 67 y.o. female here today for follow up. She continues to do well on therapy. She was last seen by Dr Epimenio Foot 11/2021 and started on imipramine. She was unable to tolerate due to feeling shaky and reported increased apneic events. She was switched to zonisamide and trazodone decreased to 25mg  at bedtime. MRI was unremarkable. Since, she reports doing well. She is tolerating therapy well. She does have some concerns of her machine not recording usage time correctly. Current machine is 7-8 hears old. Otherwise doing well. She continues to have fatigue. She did not start zonisamide. Headaches seemed to improve. She feels they are seasonal. Insomnia is fairly stable. She uses trazodone 25mg  PRN. 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      Observations/Objective:  Generalized: Well developed, in no acute distress  Mentation: Alert oriented to time, place, history taking. Follows all commands speech and language fluent   Assessment and Plan:  67 y.o. year old female  has a past medical history of Anemia, Anxiety, Back pain, Cancer (HCC), Depression, GERD (gastroesophageal reflux disease), High cholesterol, History of kidney cancer, Hypertension, Joint pain, Osteoporosis, Palpitations, Rheumatoid  arteritis (HCC), Sleep apnea, and SOB (shortness of breath). here with    ICD-10-CM   1. 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 Other headache syndrome  G44.89     3. Insomnia, unspecified type  G47.00       She is doing well on BiPAP therapy. Compliance report shows excellent compliance. I will order a HST for reevaluation with plans to order a new BiPAP machine pending results. We will continue trazodone 25mg  at bedtime as needed. Healthy lifestyle habits encouraged. She will follow up with me in 61-90 days following set up of new BiPAP machine. She verbalizes understanding.   Orders Placed This Encounter  Procedures   For home use only DME [facility]    Supplies    Order Specific Question:   Length of Need    Answer:   Lifetime    Order Specific Question:   Inspiratory pressure    Answer:   OTHER SEE COMMENTS    Order Specific Question:   Expiratory pressure    Answer:   OTHER SEE COMMENTS   Home sleep test    Standing Status:   Future    Standing Expiration Date:   10/16/2023    Order Specific Question:   Where should this test be performed:    Answer:   [facility]    No orders of the defined types were placed in this encounter.    Follow Up Instructions:  I discussed the assessment and treatment plan with the patient. The patient was provided an opportunity to ask questions and all were answered.  The patient agreed with the plan and demonstrated an understanding of the instructions.   The patient was advised to call back or seek an in-person evaluation if the symptoms worsen or if the condition fails to improve as anticipated.  I provided 15 minutes of non-face-to-face time during this encounter. Patient located at their place of residence during Mychart visit. Provider is in the office.    Shawnie Dapper, NP

## 2022-10-15 NOTE — Patient Instructions (Incomplete)

## 2022-10-16 ENCOUNTER — Encounter: Payer: Self-pay | Admitting: Family Medicine

## 2022-10-16 ENCOUNTER — Telehealth (INDEPENDENT_AMBULATORY_CARE_PROVIDER_SITE_OTHER): Payer: Medicare HMO | Admitting: Family Medicine

## 2022-10-16 DIAGNOSIS — G4731 Primary central sleep apnea: Secondary | ICD-10-CM

## 2022-10-16 DIAGNOSIS — G4489 Other headache syndrome: Secondary | ICD-10-CM

## 2022-10-16 DIAGNOSIS — G4700 Insomnia, unspecified: Secondary | ICD-10-CM

## 2022-12-24 ENCOUNTER — Encounter: Payer: Self-pay | Admitting: Family Medicine

## 2023-01-08 ENCOUNTER — Ambulatory Visit: Payer: Medicare HMO | Admitting: Neurology

## 2023-01-08 DIAGNOSIS — G4739 Other sleep apnea: Secondary | ICD-10-CM

## 2023-02-05 ENCOUNTER — Ambulatory Visit: Payer: Medicare HMO | Admitting: Neurology

## 2023-02-05 DIAGNOSIS — G4739 Other sleep apnea: Secondary | ICD-10-CM | POA: Diagnosis not present

## 2023-02-05 DIAGNOSIS — G4733 Obstructive sleep apnea (adult) (pediatric): Secondary | ICD-10-CM

## 2023-02-05 NOTE — Progress Notes (Unsigned)
[facility]  PATIENT: Karen Cannon DOB: 10/27/1955  REFERRING CLINICIAN: Dr. Loyal Jacobson HISTORY FROM: Patient REASON FOR VISIT: Obstructive sleep apnea      HISTORICAL  CHIEF COMPLAINT:  No chief complaint on file.   HISTORY OF PRESENT ILLNESS:  She is a 67 y.o. woman with severe obstructive sleep apnea with some central sleep apneas.   Update 12/06/2021  History of Present Illness: She is on VPAP 21/8/5 and has 100% compliance and AHI = 3.8.     She has chronic .  A set BiPAP level was placed on her machine, +11/7.  She reports doing well on BiPAP.    AHI was 15-18 the last few times that she has checked.  Of note, the in lab titration showed that she had many shallow breaths without desaturations and these may have been over counted by her machine.  Her insomnia does well on Ambien 10 mg and 1/2 trazodone.   She notes mild daytime sleepiness.  She never took armodafinil  She notes a mild tremor in her hands.  She does not have a family history.  It does not prevent her from doing tasks with her hands.  Headaches are more frequent..   They seem to come and go for periods of time and currently is doing better.   She notes some neck pain.   No N/V, photophobia or phonophobia.     Movements do not make a difference.  She usually does not take anything.     She has some insomnia but does well with trazodone 25 mg nightly.    She stopped the Ambien.        EPWORTH SLEEPINESS SCALE  On a scale of 0 - 3 what is the chance of dozing:  Sitting and Reading:   0 Watching TV:    0 Sitting inactive in a public place: 0 Passenger in car for one hour: 0 Lying down to rest in the afternoon: 0 Sitting and talking to someone: 0 Sitting quietly after lunch:  0 In a car, stopped in traffic:  0  Total (out of 24):    0/24    no EDS     REVIEW OF SYSTEMS: Full 14 system review of systems performed and notable only for insomnia. She reports  headaches  ALLERGIES: Allergies  Allergen Reactions   Isovue [Iopamidol] Itching    Throat itching, tightness   Iodine    Simvastatin     cramps    HOME MEDICATIONS: Outpatient Medications Prior to Visit  Medication Sig Dispense Refill   azelastine (ASTELIN) 0.1 % nasal spray Place into the nose.     calcium carbonate (OS-CAL) 1250 (500 Ca) MG chewable tablet Chew by mouth.     escitalopram (LEXAPRO) 10 MG tablet Take 10 mg by mouth daily.     estradiol (ESTRACE) 0.1 MG/GM vaginal cream Insert 1 applicatorful twice a week by vaginal route.     famotidine (PEPCID) 10 MG tablet Take by mouth.     HYDROcodone-acetaminophen (NORCO/VICODIN) 5-325 MG tablet Take 1 tablet by mouth every 6 (six) hours as needed for severe pain. 12 tablet 0   ipratropium (ATROVENT) 0.06 % nasal spray Place into the nose. (Patient not taking: Reported on 04/17/2022)     ondansetron (ZOFRAN) 4 MG tablet Take 4 mg by mouth every 6 (six) hours as needed.     ondansetron (ZOFRAN) 8 MG tablet Take 4-8 mg by mouth every 8 (eight) hours as needed.  pantoprazole (PROTONIX) 20 MG tablet Take 20 mg by mouth daily.     Pravastatin Sodium (PRAVACHOL PO) Take 20 mg by mouth daily.      traMADol (ULTRAM) 50 MG tablet Take 50 mg by mouth every 6 (six) hours as needed.     traZODone (DESYREL) 50 MG tablet Take 0.5 tablets (25 mg total) by mouth at bedtime. (Patient not taking: Reported on 04/17/2022) 15 tablet 11   valsartan (DIOVAN) 80 MG tablet Take 80 mg by mouth daily.     Vitamin D, Ergocalciferol, (DRISDOL) 1.25 MG (50000 UNIT) CAPS capsule Take 1 capsule (50,000 Units total) by mouth every 7 (seven) days. 5 capsule 0   No facility-administered medications prior to visit.    PAST MEDICAL HISTORY: Past Medical History:  Diagnosis Date   Anemia    Anxiety    Back pain    Cancer (HCC)    Depression    GERD (gastroesophageal reflux disease)    High cholesterol    History of kidney cancer    Hypertension    Joint  pain    Osteoporosis    Palpitations    Rheumatoid arteritis (HCC)    Sleep apnea    SOB (shortness of breath)     PAST SURGICAL HISTORY: Past Surgical History:  Procedure Laterality Date   HERNIA REPAIR     kidney cancer surgery     REPLACEMENT TOTAL KNEE      FAMILY HISTORY: She denies a history of OSA in family members.  SOCIAL HISTORY:  Social History   Socioeconomic History   Marital status: Married    Spouse name: Optometrist   Number of children: 3   Years of education: Masters   Highest education level: Not on file  Occupational History   Not on file  Tobacco Use   Smoking status: Never   Smokeless tobacco: Never  Vaping Use   Vaping status: Never Used  Substance and Sexual Activity   Alcohol use: No    Alcohol/week: 0.0 standard drinks of alcohol   Drug use: No   Sexual activity: Yes    Birth control/protection: Post-menopausal  Other Topics Concern   Not on file  Social History Narrative   Patient is married with one child.   Patient is left handed.   Patient has her Master's degree.   Patient drinks 2 cups daily.   Social Determinants of Health   Financial Resource Strain: Low Risk  (05/01/2021)   Received from [facility], [facility]   Overall Financial Resource Strain (CARDIA)    Difficulty of Paying Living Expenses: Not hard at all  Food Insecurity: Low Risk  (01/22/2023)   Received from [facility]   Hunger Vital Sign    Worried About Running Out of Food in the Last Year: Never true    Ran Out of Food in the Last Year: Never true  Transportation Needs: No Transportation Needs (01/22/2023)   Received from [facility]    In the past 12 months, has lack of reliable transportation kept you from medical appointments, meetings, work or from getting things needed for daily living? : No  Physical Activity: Insufficiently Active (05/01/2021)   [facility]   Exercise Vital Sign    Days of Exercise  per Week: 1 day    Minutes of Exercise per Session: 30 min  Stress: No Stress Concern Present (05/01/2021)   Received from [employer], [facility]   [employer]  of Occupational Health - Occupational Stress Questionnaire    Feeling of Stress : Not at all  Social Connections: Unknown (08/21/2021)   Received from [facility], [facility]   Social Network    Social Network: Not on file  Intimate Partner Violence: Unknown (07/11/2021)   Received from [facility], [facility]   HITS    Physically Hurt: Not on file    Insult or Talk Down To: Not on file    Threaten Physical Harm: Not on file    Scream or Curse: Not on file     PHYSICAL EXAM  There were no vitals filed for this visit.   There is no height or weight on file to calculate BMI.  General: The patient is well-developed and well-nourished and in no acute distress.  Rright occipital tenderness   Neurologic Exam  Mental status: The patient is alert and oriented x 3 at the time of the examination. The patient has apparent normal recent and remote memory, with an apparently normal attention span and concentration ability.   Speech is normal.  Cranial nerves: Extraocular movements are full.    Goof facial strngth and sensation.  Trapezius and sternocleidomastoid strength is normal. No dysarthria is noted.  T  No obvious hearing deficits are noted.  Motor:  Muscle bulk is normal.   Tone is normal. Strength is  5 / 5 in all 4 extremities.    Coordination: Good finger nose finger and heel to shin.    Gait and station: Station is normal.   Gait is normal. Tandem gait is normal for age.    Reflexes: Deep tendon reflexes are symmetric and normal bilaterally.        ASSESSMENT AND PLAN   No diagnosis found.      1.   Continue VAuto with maximum IPAP 21 cm H2O, minimum EPAP 8 cm H2O and 5 cm H2O pressure support. 2.   D/c  trazodone and start imipramine to try to help HA more. 3.   Stay active and exercise  as toerted.    Return to see me in 6 months or sooner if there are new or worsening neurologic or sleep issues.   Asa Lente M.D. PhD 02/05/2023, 10:06 AM Certified in Neurology, Sleep Medicine , Neurophysiology and Neuroimaging  [facility] [address] [place], [place] [address] [phone]

## 2023-02-07 ENCOUNTER — Encounter: Payer: Self-pay | Admitting: *Deleted

## 2023-02-11 ENCOUNTER — Telehealth: Payer: Self-pay | Admitting: Neurology

## 2023-02-11 NOTE — Telephone Encounter (Signed)
[employer] medicare pending uploaded notes.

## 2023-02-19 NOTE — Telephone Encounter (Signed)
[address] medicare [place]: [id number] (exp. 02/11/23 to 08/12/23)   Sent a mychart and left voicemail.

## 2023-02-21 NOTE — Telephone Encounter (Signed)
I spoke with the patient.  [employer]- [employer] medicare [place]: [id number] (exp. 02/11/23 to 08/12/23)   Patient is scheduled at [facility] for 03/05/23 at 8 pm.  Mailed packet to the patient.

## 2023-03-05 ENCOUNTER — Ambulatory Visit: Payer: Medicare HMO | Admitting: Neurology

## 2023-03-05 DIAGNOSIS — G4739 Other sleep apnea: Secondary | ICD-10-CM

## 2023-03-10 NOTE — Progress Notes (Signed)
General Information  Name: Sahalie, Connor BMI: 31.18 Physician: Epimenio Foot  ID: [id number] Height: 63.0 in Technician: Margaretann Loveless  Sex: Female Weight: 176.0 lb Record: [id number]  Age: 67 [May 03, 1955] Date: 03/05/2023 Scorer: Margaretann Loveless  Medical & Medication History    Brandon Storie is a 67 y.o. female with OSA  She is tolerating therapy well. She does have some concerns of her machine not recording usage time correctly. Current machine is 59-12 years old. She continues to have fatigue. Insomnia is fairly stable. She uses trazodone 25mg  PRN. HST 02/05/2023 showed moderately severe overall obstructive sleep apnea with a pAHI 3% of 24.3/h. She had severe sleep apnea during REM sleep with a REM AHI of 55/h.  There was negligible nocturnal hypoxemia with only 1.1-minute of SaO2 below 88%   Medications:   Vitamin D3, Lexapro, Estrace, Pepcid, Ferrous Sulfate, Magnesium Oxide, Theragran, Zofran, Protonix, Pravachol, Metamucil, Tramadol, Trazodone, Diovan, Flonase   Sleep Disorder      Results   The patient came into the sleep lab for a CPAP/BiPAP titration study The patient took Trazodone prior to sleep. The patient was fitted with ResMed F20 Air Touch [facility]) size Small.  CPAP was tried at 8cm, 9cm and 10cmH2O with EPR of 1. The patient could not tolerate any of those pressures. The patient was switched to BiPAP. BiPAP was tried at 8/4 cm and 10/5cm.  BiPAP was titrated up to 12/7cmH2O due to respiratory events.    AHI was 0.9 on +10/5, 20.4/h on +11/6 and 1.9 on +12/7 cmH2O.   EKG showed NSR. Mild snoring. Respiratory events scored with a 4% desat. The patient slept mostly lateral. No CSA observed.   PLAN.    Initiate BiPAP at +12/7 cm H20 with heated humidifier with Resmed F20 Air Touch (FFM) size small Download in 30 to 90 days Follow up with Dr. Despina Arias A. Epimenio Foot, MD. PhD. [facility] Board Certified in Neurology and Sleep Medicine       Lights out: 08:56:19 PM Lights on: 05:06:43  AM   Time Total Supine Side Prone Upright  Recording (TRT) 8h 10.44m 0h 0.74m 8h 10.66m 0h 0.67m 0h 0.27m  Sleep (TST) 6h 13.35m 0h 0.32m 6h 13.28m 0h 0.23m 0h 0.84m   Latency N1 N2 N3 REM Onset Per. Slp. Eff.  Actual 0h 0.38m 0h 2.22m 0h 30.21m 2h 58.5m 1h 29.46m 1h 29.49m 76.15%   Stg Dur Wake N1 N2 N3 REM  Total 117.0 6.0 258.0 36.0 73.5  Supine 0.5 0.0 0.0 0.0 0.0  Side 116.5 6.0 258.0 36.0 73.5  Prone 0.0 0.0 0.0 0.0 0.0  Upright 0.0 0.0 0.0 0.0 0.0   Stg % Wake N1 N2 N3 REM  Total 23.9 1.6 69.1 9.6 19.7  Supine 0.1 0.0 0.0 0.0 0.0  Side 23.8 1.6 69.1 9.6 19.7  Prone 0.0 0.0 0.0 0.0 0.0  Upright 0.0 0.0 0.0 0.0 0.0     Apnea Summary Sub Supine Side Prone Upright  Total 13 Total 13 0 13 0 0    REM 2 0 2 0 0    NREM 11 0 11 0 0  Obs 13 REM 2 0 2 0 0    NREM 11 0 11 0 0  Mix 0 REM 0 0 0 0 0    NREM 0 0 0 0 0  Cen 0 REM 0 0 0 0 0    NREM 0 0 0 0 0   Rera Summary Sub Supine Side Prone Upright  Total 0 Total 0 0 0 0 0    REM 0 0 0 0 0    NREM 0 0 0 0 0   Hypopnea Summary Sub Supine Side Prone Upright  Total 2 Total 2 0 2 0 0    REM 0 0 0 0 0    NREM 2 0 2 0 0   4% Hypopnea Summary Sub Supine Side Prone Upright  Total (4%) 0 Total 0 0 0 0 0    REM 0 0 0 0 0    NREM 0 0 0 0 0     AHI Total Obs Mix Cen  2.41 Apnea 2.09 2.09 0.00 0.00   Hypopnea 0.32 -- -- --  2.09 Hypopnea (4%) 0.00 -- -- --    Total Supine Side Prone Upright  Position AHI 2.41 0.00 2.41 0.00 0.00  REM AHI 1.63   NREM AHI 2.60   Position RDI 2.41 0.00 2.41 0.00 0.00  REM RDI 1.63   NREM RDI 2.60    4% Hypopnea Total Supine Side Prone Upright  Position AHI (4%) 2.09 0.00 2.09 0.00 0.00  REM AHI (4%) 1.63   NREM AHI (4%) 2.20   Position RDI (4%) 2.09 0.00 2.09 0.00 0.00  REM RDI (4%) 1.63   NREM RDI (4%) 2.20    Desaturation Information Threshold: 2% <100% <90% <80% <70% <60% <50% <40%  Supine 0.0 0.0 0.0 0.0 0.0 0.0 0.0  Side 75.0 1.0 0.0 0.0 0.0 0.0 0.0  Prone 0.0 0.0 0.0 0.0 0.0 0.0 0.0   Upright 0.0 0.0 0.0 0.0 0.0 0.0 0.0  Total 75.0 1.0 0.0 0.0 0.0 0.0 0.0  Index 11.2 0.1 0.0 0.0 0.0 0.0 0.0   Threshold: 3% <100% <90% <80% <70% <60% <50% <40%  Supine 0.0 0.0 0.0 0.0 0.0 0.0 0.0  Side 16.0 1.0 0.0 0.0 0.0 0.0 0.0  Prone 0.0 0.0 0.0 0.0 0.0 0.0 0.0  Upright 0.0 0.0 0.0 0.0 0.0 0.0 0.0  Total 16.0 1.0 0.0 0.0 0.0 0.0 0.0  Index 2.4 0.1 0.0 0.0 0.0 0.0 0.0   Threshold: 4% <100% <90% <80% <70% <60% <50% <40%  Supine 0.0 0.0 0.0 0.0 0.0 0.0 0.0  Side 2.0 1.0 0.0 0.0 0.0 0.0 0.0  Prone 0.0 0.0 0.0 0.0 0.0 0.0 0.0  Upright 0.0 0.0 0.0 0.0 0.0 0.0 0.0  Total 2.0 1.0 0.0 0.0 0.0 0.0 0.0  Index 0.3 0.1 0.0 0.0 0.0 0.0 0.0   Threshold: 4% <100% <90% <80% <70% <60% <50% <40%  Supine 0 0 0 0 0 0 0  Side 2 1 0 0 0 0 0  Prone 0 0 0 0 0 0 0  Upright 0 0 0 0 0 0 0  Total 2 1 0 0 0 0 0   Awakening/Arousal Information # of Awakenings 9  Wake after sleep onset 27.24m  Wake after persistent sleep 27.70m   Arousal Assoc. Arousals Index  Apneas 10 1.6  Hypopneas 1 0.2  Leg Movements 0 0.0  Snore 0 0.0  PTT Arousals 0 0.0  Spontaneous 41 6.6  Total 52 8.4  Leg Movement Information PLMS LMs Index  Total LMs during PLMS 0 0.0  LMs w/ Microarousals 0 0.0   LM LMs Index  w/ Microarousal 0 0.0  w/ Awakening 0 0.0  w/ Resp Event 0 0.0  Spontaneous 0 0.0  Total 0 0.0     Desaturation threshold setting: 4% Minimum desaturation setting: 10 seconds SaO2 nadir:  88% The longest event was a 54 sec obstructive Apnea with a minimum SaO2 of 93%. The lowest SaO2 was 90% associated with a 12 sec obstructive Apnea. EKG Rates EKG Avg Max Min  Awake 61 79 48  Asleep 61 78 51  EKG Events: N/A     [facility] CPAP/Bilevel Report    General Information  Name: Aniesha, Hinks BMI: 31 Physician: ,   ID: [id number] Height: 63 in Technician: Margaretann Loveless  Sex: Female Weight: 176 lb Record: [id number]  Age: 56 [1955-06-12] Date:  03/05/2023 Scorer: Zenon Mayo Fields   Recommended Settings IPAP: N/A cmH20 EPAP: N/A cmH2O AHI: N/A AHI (4%): N/A   Pressure IPAP/EPAP 00 08 / 04 09 / 04 10 / 05 11 / 06 12 / 07 08   O2 Vol 0.0 0.0 0.0 0.0 0.0 0.0 0.0  Time TRT 15.68m 112.23m 57.42m 92.70m 42.70m 128.61m 43.82m   TST 0.58m 81.19m 57.22m 86.15m 23.46m 125.40m 0.20m  Sleep Stage % Wake 100.0 27.2 0.9 6.0 44.7 2.3 100.0   % REM 0.0 0.0 0.0 29.5 0.0 38.4 0.0   % N1 0.0 3.7 0.0 0.0 6.4 1.2 0.0   % N2 0.0 76.1 65.8 70.5 93.6 60.4 0.0   % N3 0.0 20.2 34.2 0.0 0.0 0.0 0.0  Respiratory Total Events 0 0 2 1 8 4  0   Obs. Apn. 0 0 0 1 8 4  0   Mixed Apn. 0 0 0 0 0 0 0   Cen. Apn. 0 0 0 0 0 0 0   Hypopneas 0 0 2 0 0 0 0   AHI 0.00 0.00 2.11 0.69 20.43 1.92 0.00   Supine AHI 0.00 0.00 0.00 0.00 0.00 0.00 0.00   Prone AHI 0.00 0.00 0.00 0.00 0.00 0.00 0.00   Side AHI 0.00 0.00 2.11 0.69 20.43 1.92 0.00  Respiratory (4%) Hypopneas (4%) 0.00 0.00 0.00 0.00 0.00 0.00 0.00   AHI (4%) 0.00 0.00 0.00 0.69 20.43 1.92 0.00   Supine AHI (4%) 0.00 0.00 0.00 0.00 0.00 0.00 0.00   Prone AHI (4%) 0.00 0.00 0.00 0.00 0.00 0.00 0.00   Side AHI (4%) 0.00 0.00 0.00 0.69 20.43 1.92 0.00  Desat Profile <= 90% 0.67m 0.82m 0.58m 0.80m 4.25m 0.56m 0.59m   <= 80% 0.17m 0.64m 0.34m 0.26m 4.60m 0.22m 0.44m   <= 70% 0.46m 0.91m 0.22m 0.57m 4.86m 0.34m 0.40m   <= 60% 0.5m 0.59m 0.35m 0.73m 4.32m 0.71m 0.54m  Arousal Index Apnea 0.0 0.0 0.0 0.0 15.3 1.9 0.0   Hypopnea 0.0 0.0 1.1 0.0 0.0 0.0 0.0   LM 0.0 0.0 0.0 0.0 0.0 0.0 0.0   Spontaneous 0.0 4.4 6.3 3.5 28.1 6.2 0.0

## 2023-03-12 ENCOUNTER — Encounter: Payer: Self-pay | Admitting: *Deleted

## 2023-03-12 ENCOUNTER — Other Ambulatory Visit: Payer: Self-pay | Admitting: *Deleted

## 2023-03-12 DIAGNOSIS — G4739 Other sleep apnea: Secondary | ICD-10-CM

## 2023-05-20 ENCOUNTER — Ambulatory Visit: Payer: Medicare HMO | Admitting: Neurology

## 2023-07-10 NOTE — Progress Notes (Unsigned)
 PATIENT: Karen Cannon DOB: [birth year]  REASON FOR VISIT: follow up HISTORY FROM: patient  No chief complaint on file.    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 She recently received a new BiPAP.   Headaches? Trazodone   10/16/22 ALL (Mychart): Karen Cannon is a 68 y.o. female here today for follow up. She continues to do well on therapy. She was last seen by Dr Epimenio Foot 11/2021 and started on imipramine. She was unable to tolerate due to feeling shaky and reported increased apneic events. She was switched to zonisamide and trazodone decreased to 25mg  at bedtime. MRI was unremarkable. Since, she reports doing well. She is tolerating therapy well. She does have some concerns of her machine not recording usage time correctly. Current machine is 7-8 hears old. Otherwise doing well. She continues to have fatigue. She did not start zonisamide. Headaches seemed to improve. She feels they are seasonal. Insomnia is fairly stable. She uses trazodone 25mg  PRN. 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 She has discontinued Ambien due to concerns of a link to dementia mentioned by PCP. She continues trazodone 50mg  at bedtime. PCP has added duloxetine 20-40mg  daily and discontinued escitalopram. She does feel depression is improving. She has not increased dose to 40. She is sleeping fairly well. She has lost 20 pounds.   She is doing well on BiPAP therapy. She is using therapy every night.      08/19/2019 ALL:  Karen Cannon is a 68 y.o. female here today for follow up for insomnia and complex sleep apnea. She continues BiPAP therapy nightly. Compliance report in 02/2019 showed excellent compliance. She continues Ambien 10mg  and trazodone 50-100mg  at bedtime. No adverse effects noted. She has tried  to decrease Ambien dose over the past week. She has taken 5mg  every night and 50mg  of trazodone for the past week. She seems to be doing ok. Headaches and memory are stable. She walks 5 days a week. She plays brain games frequently. She is feeling well today and without complaints.   Compliance report dated 07/20/2019 through 08/18/2019 reveals that she is used BiPAP therapy every night for compliance of 100%.  She is used BiPAP greater than 4 hours every night for compliance of 100%.  Average usage was 8 hours and 26 minutes.  Residual AHI remains at 7.7 with max IPAP of 21 cm of water, minimum EPAP of 8 cm of water and pressure support of 5 cm of water.  Leak in the 95th percentile of 16.5 L/min.  3.5 central apneas and 3.6 obstructive apneas noted.  HISTORY: (copied from my note on 02/11/2019)  Karen Cannon is a 68 y.o. 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 She reports doing well. She is sleeping better. She continues Ambien 10mg  and trazodone 50-100mg  at night (usually 50mg ). She denies any adverse effects with medications. She is using BiPAP nightly. She has monitored events each night and reports about 7-8 events/hr on average.  Compliance report dated 01/10/2019 through 02/08/2019 reveals that she has used BiPAP therapy 30 out of the last 30 days for compliance of 100%.  All 30 days she used BiPAP greater than 4 hours.  Average usage was 8 hours and 34 minutes.  AHI was  7.7 with max IPAP of 21 cm of water and minimum EPAP of 8 cmH2O.  Pressure support of 5 cm of water.  There was no significant leak noted.  Approximately 3.8 central and 3.6 obstructive apneas.   HISTORY: (copied from Dr Bonnita Hollow note on 08/18/2018)   Since the last visit, she had a BiPAP titration.  She did not meet criteria for ASV or ST modes.  A set BiPAP level was placed on her machine, +11/7.  She reports doing well on BiPAP.    AHI was 15-18 the last few times that she has checked.  Of note,  the in lab titration showed that she had many shallow breaths without desaturations and these may have been over counted by her machine.   Her insomnia does well on Ambien 10 mg and 1/2 trazodone.   She notes mild daytime sleepiness.  She never took armodafinil   She notes a mild tremor in her hands.  She does not have a family history.  It does not prevent her from doing tasks with her hands.   Headaches are better.   They seem to come and go for periods of time and currently is doing better.   She notes some neck pain.    REVIEW OF SYSTEMS: Out of a complete 14 system review of symptoms, the patient complains only of the following symptoms, insomnia, headaches and all other reviewed systems are negative.  ALLERGIES: Allergies  Allergen Reactions   Isovue [Iopamidol] Itching    Throat itching, tightness   Iodine    Simvastatin     cramps    HOME MEDICATIONS: Outpatient Medications Prior to Visit  Medication Sig Dispense Refill   azelastine (ASTELIN) 0.1 % nasal spray Place into the nose.     calcium carbonate (OS-CAL) 1250 (500 Ca) MG chewable tablet Chew by mouth.     escitalopram (LEXAPRO) 10 MG tablet Take 10 mg by mouth daily.     estradiol (ESTRACE) 0.1 MG/GM vaginal cream Insert 1 applicatorful twice a week by vaginal route.     famotidine (PEPCID) 10 MG tablet Take by mouth.     HYDROcodone-acetaminophen (NORCO/VICODIN) 5-325 MG tablet Take 1 tablet by mouth every 6 (six) hours as needed for severe pain. 12 tablet 0   ipratropium (ATROVENT) 0.06 % nasal spray Place into the nose. (Patient not taking: Reported on 04/17/2022)     ondansetron (ZOFRAN) 4 MG tablet Take 4 mg by mouth every 6 (six) hours as needed.     ondansetron (ZOFRAN) 8 MG tablet Take 4-8 mg by mouth every 8 (eight) hours as needed.     pantoprazole (PROTONIX) 20 MG tablet Take 20 mg by mouth daily.     Pravastatin Sodium (PRAVACHOL PO) Take 20 mg by mouth daily.      traMADol (ULTRAM) 50 MG tablet Take 50 mg  by mouth every 6 (six) hours as needed.     traZODone (DESYREL) 50 MG tablet Take 0.5 tablets (25 mg total) by mouth at bedtime. (Patient not taking: Reported on 04/17/2022) 15 tablet 11   valsartan (DIOVAN) 80 MG tablet Take 80 mg by mouth daily.     Vitamin D, Ergocalciferol, (DRISDOL) 1.25 MG (50000 UNIT) CAPS capsule Take 1 capsule (50,000 Units total) by mouth every 7 (seven) days. 5 capsule 0   No facility-administered medications prior to visit.    PAST MEDICAL HISTORY: Past Medical History:  Diagnosis Date   Anemia    Anxiety    Back pain  Cancer (HCC)    Depression    GERD (gastroesophageal reflux disease)    High cholesterol    History of kidney cancer    Hypertension    Joint pain    Osteoporosis    Palpitations    Rheumatoid arteritis (HCC)    Sleep apnea    SOB (shortness of breath)     PAST SURGICAL HISTORY: Past Surgical History:  Procedure Laterality Date   HERNIA REPAIR     kidney cancer surgery     REPLACEMENT TOTAL KNEE      FAMILY HISTORY: Family History  Problem Relation Age of Onset   Hypertension Father    Hyperlipidemia Father    Diabetes Father    Obesity Father     SOCIAL HISTORY: Social History   Socioeconomic History   Marital status: Married    Spouse name: Optometrist   Number of children: 3   Years of education: Masters   Highest education level: Not on file  Occupational History   Not on file  Tobacco Use   Smoking status: Never   Smokeless tobacco: Never  Vaping Use   Vaping status: Never Used  Substance and Sexual Activity   Alcohol use: No    Alcohol/week: 0.0 standard drinks of alcohol   Drug use: No   Sexual activity: Yes    Birth control/protection: Post-menopausal  Other Topics Concern   Not on file  Social History Narrative   Patient is married with one child.   Patient is left handed.   Patient has her Master's degree.   Patient drinks 2 cups daily.   Social Drivers of Corporate investment banker Strain:  Low Risk  (05/01/2021)   Received from [facility]   Overall Financial Resource Strain (CARDIA)    Difficulty of Paying Living Expenses: Not hard at all  Food Insecurity: Low Risk  (01/22/2023)   Received from [facility]   Hunger Vital Sign    Worried About Running Out of Food in the Last Year: Never true    Ran Out of Food in the Last Year: Never true  Transportation Needs: No Transportation Needs (01/22/2023)   Received from [facility]    In the past 12 months, has lack of reliable transportation kept you from medical appointments, meetings, work or from getting things needed for daily living? : No  Physical Activity: Insufficiently Active (05/01/2021)   Received from [facility]   Exercise Vital Sign    Days of Exercise per Week: 1 day    Minutes of Exercise per Session: 30 min  Stress: No Stress Concern Present (05/01/2021)   Received from [facility], [facility] Occupational [facility] - Occupational Stress Questionnaire    Feeling of Stress : Not at all  Social Connections: Unknown (08/21/2021)   Received from [facility], [facility]   Social Network    Social Network: Not on file  Intimate Partner Violence: Unknown (07/11/2021)   Received from [facility], [facility]   HITS    Physically Hurt: Not on file    Insult or Talk Down To: Not on file    Threaten Physical Harm: Not on file    Scream or Curse: Not on file      PHYSICAL EXAM  There were no vitals filed for this visit.  There is no height or weight on file to calculate BMI.  Generalized: Well developed, in no acute distress  Cardiology: normal rate and rhythm, no murmur noted Respiratory: clear to auscultation bilaterally  Neurological examination  Mentation: Alert oriented to time, place, history taking. Follows all commands speech and language fluent Cranial nerve II-XII: Pupils were equal round reactive to light.  Extraocular movements were full, visual field were full  Motor: The motor testing reveals 5 over 5 strength of all 4 extremities. Good symmetric motor tone is noted throughout.  Gait and station: Gait is normal.   DIAGNOSTIC DATA (LABS, IMAGING, TESTING) - I reviewed patient records, labs, notes, testing and imaging myself where available.      No data to display           Lab Results  Component Value Date   WBC 5.4 03/06/2022   HGB 13.8 03/06/2022   HCT 40.2 03/06/2022   MCV 97 03/06/2022   PLT 264 03/06/2022      Component Value Date/Time   NA 141 08/27/2015 1440   K 3.4 (L) 08/27/2015 1440   CL 106 08/27/2015 1440   CO2 27 08/27/2015 1440   GLUCOSE 92 08/27/2015 1440   BUN 9 08/27/2015 1440   CREATININE 0.49 08/27/2015 1440   CALCIUM 9.7 08/27/2015 1440   PROT 7.1 08/27/2015 1440   ALBUMIN 4.1 08/27/2015 1440   AST 17 08/27/2015 1440   ALT 17 08/27/2015 1440   ALKPHOS 65 08/27/2015 1440   BILITOT 0.5 08/27/2015 1440   GFRNONAA >60 08/27/2015 1440   GFRAA >60 08/27/2015 1440   No results found for: "CHOL", "HDL", "LDLCALC", "LDLDIRECT", "TRIG", "CHOLHDL" Lab Results  Component Value Date   HGBA1C 5.7 (H) 03/06/2022   No results found for: "VITAMINB12" No results found for: "TSH"     ASSESSMENT AND PLAN 68 y.o. year old female  has a past medical history of Anemia, Anxiety, Back pain, Cancer (HCC), Depression, GERD (gastroesophageal reflux disease), High cholesterol, History of kidney cancer, Hypertension, Joint pain, Osteoporosis, Palpitations, Rheumatoid arteritis (HCC), Sleep apnea, and SOB (shortness of breath). here with   No diagnosis found.    Yuriana is doing well today.  She continues BiPAP therapy nightly.  Compliance report reveals excellent compliance.  Residual AHI at goal.  She has lost 20 pounds.  We will continue trazodone 50 mg at night. Zolpidem discontinued. She was encouraged to continue regular physical and mental activity. Adequate  hydration and well-balanced diet encouraged.  She will follow-up with me in 1 year, sooner if needed.  She verbalizes understanding and agreement with this plan.   No orders of the defined types were placed in this encounter.    No orders of the defined types were placed in this encounter.      Shawnie Dapper, FNP-C 07/10/2023, 1:46 PM [facility] [address], [address] [place], [place] [address] [phone]

## 2023-07-10 NOTE — Patient Instructions (Incomplete)

## 2023-07-10 NOTE — Progress Notes (Unsigned)
 Karen Cannon

## 2023-07-11 ENCOUNTER — Encounter: Payer: Self-pay | Admitting: Family Medicine

## 2023-07-11 ENCOUNTER — Ambulatory Visit: Payer: Medicare HMO | Admitting: Family Medicine

## 2023-07-11 VITALS — BP 103/63 | HR 76 | Ht 63.0 in | Wt 177.0 lb

## 2023-07-11 DIAGNOSIS — G4739 Other sleep apnea: Secondary | ICD-10-CM | POA: Diagnosis not present

## 2023-07-11 DIAGNOSIS — G4489 Other headache syndrome: Secondary | ICD-10-CM

## 2023-07-11 DIAGNOSIS — G4700 Insomnia, unspecified: Secondary | ICD-10-CM

## 2023-09-05 ENCOUNTER — Telehealth: Payer: Self-pay | Admitting: Family Medicine

## 2023-09-05 NOTE — Telephone Encounter (Signed)
 Will you please attach a 30 day report for review of AHI after pressure changes? TY!

## 2023-09-06 ENCOUNTER — Other Ambulatory Visit: Payer: Self-pay | Admitting: *Deleted

## 2023-09-06 MED ORDER — TRAZODONE HCL 50 MG PO TABS: 25.0000 mg | ORAL_TABLET | Freq: Every day | ORAL | 3 refills | Status: AC

## 2024-07-16 ENCOUNTER — Ambulatory Visit: Admitting: Family Medicine
